# Patient Record
Sex: Female | Born: 1990 | Race: Black or African American | Hispanic: No | Marital: Single | Smoking: Never smoker
Health system: Southern US, Community
[De-identification: ages and names within clinical notes are randomized; demographics above are authoritative.]

## PROBLEM LIST (undated history)

## (undated) DIAGNOSIS — D649 Anemia, unspecified: Secondary | ICD-10-CM

## (undated) DIAGNOSIS — G4730 Sleep apnea, unspecified: Secondary | ICD-10-CM

## (undated) DIAGNOSIS — F32A Depression, unspecified: Secondary | ICD-10-CM

## (undated) DIAGNOSIS — F419 Anxiety disorder, unspecified: Secondary | ICD-10-CM

## (undated) DIAGNOSIS — T7840XA Allergy, unspecified, initial encounter: Secondary | ICD-10-CM

## (undated) DIAGNOSIS — I10 Essential (primary) hypertension: Secondary | ICD-10-CM

## (undated) HISTORY — PX: TOOTH EXTRACTION: SUR596

## (undated) HISTORY — DX: Depression, unspecified: F32.A

## (undated) HISTORY — DX: Sleep apnea, unspecified: G47.30

## (undated) HISTORY — DX: Essential (primary) hypertension: I10

## (undated) HISTORY — DX: Allergy, unspecified, initial encounter: T78.40XA

## (undated) HISTORY — DX: Anxiety disorder, unspecified: F41.9

## (undated) HISTORY — DX: Anemia, unspecified: D64.9

---
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
## 2013-12-06 ENCOUNTER — Emergency Department (INDEPENDENT_AMBULATORY_CARE_PROVIDER_SITE_OTHER)
Admission: EM | Admit: 2013-12-06 | Discharge: 2013-12-06 | Disposition: A | Discharge status: Home / Self Care | Payer: Medicaid Other | Source: Home / Self Care | Attending: Family Medicine | Admitting: Family Medicine

## 2013-12-06 ENCOUNTER — Encounter (HOSPITAL_COMMUNITY): Payer: Self-pay | Admitting: Emergency Medicine

## 2013-12-06 DIAGNOSIS — K589 Irritable bowel syndrome without diarrhea: Secondary | ICD-10-CM

## 2013-12-06 MED ORDER — ONDANSETRON HCL 4 MG PO TABS: 4.0000 mg | ORAL_TABLET | Freq: Four times a day (QID) | ORAL | Status: DC

## 2013-12-06 MED ORDER — RANITIDINE HCL 150 MG PO TABS: 150.0000 mg | ORAL_TABLET | Freq: Two times a day (BID) | ORAL | Status: DC

## 2013-12-06 MED ORDER — ONDANSETRON 4 MG PO TBDP
ORAL_TABLET | ORAL | Status: AC
  Filled 2013-12-06: qty 2

## 2013-12-06 MED ORDER — GI COCKTAIL ~~LOC~~
ORAL | Status: AC
  Filled 2013-12-06: qty 30

## 2013-12-06 MED ORDER — ONDANSETRON 4 MG PO TBDP
4.0000 mg | ORAL_TABLET | Freq: Once | ORAL | Status: AC
  Administered 2013-12-06: 4 mg via ORAL

## 2013-12-06 MED ORDER — GI COCKTAIL ~~LOC~~
30.0000 mL | Freq: Once | ORAL | Status: AC
  Administered 2013-12-06: 30 mL via ORAL

## 2013-12-06 NOTE — ED Provider Notes (Signed)
CSN: [id number]     Arrival date & time 12/06/13  1744 History   First MD Initiated Contact with Patient 12/06/13 1912     Chief Complaint  Patient presents with  . Cough   (Consider location/radiation/quality/duration/timing/severity/associated sxs/prior Treatment) Patient is a 23 y.o. female presenting with abdominal pain. The history is provided by the patient.  Abdominal Pain Pain location:  Epigastric Pain quality: burning and sharp   Pain radiates to:  Does not radiate Pain severity:  Mild Onset quality:  Gradual Duration:  2 days Progression:  Unchanged Chronicity:  New Context: eating   Context: not alcohol use, not awakening from sleep, not diet changes, not laxative use, not previous surgeries, not recent illness, not sick contacts and not suspicious food intake   Relieved by:  None tried Worsened by:  Nothing tried Ineffective treatments:  None tried Associated symptoms: diarrhea and nausea   Associated symptoms: no constipation, no dysuria, no fever, no melena, no vaginal bleeding, no vaginal discharge and no vomiting     History reviewed. No pertinent past medical history. History reviewed. No pertinent past surgical history. History reviewed. No pertinent family history. History  Substance Use Topics  . Smoking status: Never Smoker   . Smokeless tobacco: Not on file  . Alcohol Use: No   OB History   Grav Para Term Preterm Abortions TAB SAB Ect Mult Living                 Review of Systems  Constitutional: Negative.  Negative for fever.  HENT: Negative.   Gastrointestinal: Positive for nausea, abdominal pain and diarrhea. Negative for vomiting, constipation, blood in stool, melena and rectal pain.  Genitourinary: Negative for dysuria, vaginal bleeding and vaginal discharge.    Allergies  Shellfish allergy  Home Medications   Current Outpatient Rx  Name  Route  Sig  Dispense  Refill  . ondansetron (ZOFRAN) 4 MG tablet   Oral   Take 1 tablet (4 mg  total) by mouth every 6 (six) hours. Prn n/v   6 tablet   1   . ranitidine (ZANTAC) 150 MG tablet   Oral   Take 1 tablet (150 mg total) by mouth 2 (two) times daily.   60 tablet   0    BP 121/57  Pulse 98  Temp(Src) 98.6 F (37 C) (Oral)  Resp 16  SpO2 100% Physical Exam  Nursing note and vitals reviewed. Constitutional: She is oriented to person, place, and time. She appears well-developed and well-nourished.  HENT:  Mouth/Throat: Oropharynx is clear and moist.  Eyes: Conjunctivae are normal. Pupils are equal, round, and reactive to light.  Neck: Normal range of motion. Neck supple.  Pulmonary/Chest: Breath sounds normal.  Abdominal: Soft. Normal appearance and bowel sounds are normal. She exhibits no distension and no mass. There is no hepatosplenomegaly. There is tenderness in the epigastric area. There is no rebound and no guarding.  Lymphadenopathy:    She has no cervical adenopathy.  Neurological: She is alert and oriented to person, place, and time.  Skin: Skin is warm and dry.    ED Course  Procedures (including critical care time) Labs Review Labs Reviewed - No data to display Imaging Review No results found.  EKG Interpretation    Date/Time:    Ventricular Rate:    PR Interval:    QRS Duration:   QT Interval:    QTC Calculation:   R Axis:     Text Interpretation:  MDM      Linna Hoff, MD 12/06/13 [phone]

## 2013-12-06 NOTE — Discharge Instructions (Signed)
Clear liquid diet tonight as tolerated, advance on tues as improved, use medicine as needed, see your doctor if any problems.

## 2013-12-06 NOTE — ED Notes (Signed)
C/o cough , nausea since 1-3

## 2015-06-13 ENCOUNTER — Emergency Department (HOSPITAL_COMMUNITY)
Admission: EM | Admit: 2015-06-13 | Discharge: 2015-06-13 | Disposition: A | Discharge status: Home / Self Care | Payer: No Typology Code available for payment source | Attending: Emergency Medicine | Admitting: Emergency Medicine

## 2015-06-13 ENCOUNTER — Encounter (HOSPITAL_COMMUNITY): Payer: Self-pay | Admitting: Vascular Surgery

## 2015-06-13 DIAGNOSIS — Z79899 Other long term (current) drug therapy: Secondary | ICD-10-CM | POA: Insufficient documentation

## 2015-06-13 DIAGNOSIS — L0231 Cutaneous abscess of buttock: Secondary | ICD-10-CM | POA: Insufficient documentation

## 2015-06-13 MED ORDER — CEPHALEXIN 500 MG PO CAPS: 500.0000 mg | ORAL_CAPSULE | Freq: Four times a day (QID) | ORAL | Status: DC

## 2015-06-13 MED ORDER — CEPHALEXIN 250 MG PO CAPS
500.0000 mg | ORAL_CAPSULE | Freq: Once | ORAL | Status: AC
  Administered 2015-06-13: 500 mg via ORAL
  Filled 2015-06-13: qty 2

## 2015-06-13 NOTE — Discharge Instructions (Signed)
Abscess Take anabiotic says prescribed. Return for fever or no improvement in 48 hours. Return for recheck in 48 hours. Follow up with [place] [facility]. Apply warm compresses to the area several times a day. You could also soak in Epsom salt. An abscess (boil or furuncle) is an infected area on or under the skin. This area is filled with yellowish-white fluid (pus) and other material (debris). HOME CARE   Only take medicines as told by your doctor.  If you were given antibiotic medicine, take it as directed. Finish the medicine even if you start to feel better.  If gauze is used, follow your doctor's directions for changing the gauze.  To avoid spreading the infection:  Keep your abscess covered with a bandage.  Wash your hands well.  Do not share personal care items, towels, or whirlpools with others.  Avoid skin contact with others.  Keep your skin and clothes clean around the abscess.  Keep all doctor visits as told. GET HELP RIGHT AWAY IF:   You have more pain, puffiness (swelling), or redness in the wound site.  You have more fluid or blood coming from the wound site.  You have muscle aches, chills, or you feel sick.  You have a fever. MAKE SURE YOU:   Understand these instructions.  Will watch your condition.  Will get help right away if you are not doing well or get worse. Document Released: 05/06/2008 Document Revised: 05/19/2012 Document Reviewed: 01/31/2012 [facility] Patient Information 2015 [place], [place]. This information is not intended to replace advice given to you by your health care provider. Make sure you discuss any questions you have with your health care provider.

## 2015-06-13 NOTE — ED Notes (Signed)
Pt reports to the ED for eval of abscess to the right gluteal fold. Reports she noticed it on Satuday and has been using boil cream and warm epsom salt soaks however it continues to bother her. Increased swelling, erythema, pain, and warmth noted at site. It appears to have a pus collection below the surface of the skin. No drainage at this time. Pt A&Ox4, resp e/u, and skin warm and dry.

## 2015-06-13 NOTE — ED Provider Notes (Signed)
CSN: [id number]     Arrival date & time 06/13/15  2147 History   This chart was scribed for Catha GosselinHanna Patel-Mills, PA-C working with No att. providers found by Elveria Risingimelie Horne, ED Scribe. This patient was seen in room TR09C/TR09C and the patient's care was started at 10:25 PM.   Chief Complaint  Patient presents with  . Abscess   The history is provided by the patient. No language interpreter was used.   HPI Comments: Dana Burnett is a 24 y.o. female who presents to the Emergency Department complaining of a localized area of pain and swelling to right lower buttocks, which patient noticed 3-4 days ago. Patient uncertain of drainage from the site. Patient reports attempted treatment with warm soaks and topical cream, without relief. She states she has never had this before. Patient denies fever or chills.   History reviewed. No pertinent past medical history. History reviewed. No pertinent past surgical history. No family history on file. History  Substance Use Topics  . Smoking status: Never Smoker   . Smokeless tobacco: Not on file  . Alcohol Use: No   OB History    No data available     Review of Systems  Constitutional: Negative for fever.  Skin: Positive for color change.       Abscess    Allergies  Shellfish allergy  Home Medications   Prior to Admission medications   Medication Sig Start Date End Date Taking? Authorizing Provider  cephALEXin (KEFLEX) 500 MG capsule Take 1 capsule (500 mg total) by mouth 4 (four) times daily. 06/13/15   Ayden Apodaca Patel-Mills, PA-C  ondansetron (ZOFRAN) 4 MG tablet Take 1 tablet (4 mg total) by mouth every 6 (six) hours. Prn n/v 12/06/13   Linna HoffJames D Kindl, MD  ranitidine (ZANTAC) 150 MG tablet Take 1 tablet (150 mg total) by mouth 2 (two) times daily. 12/06/13   Linna HoffJames D Kindl, MD   Triage Vitals: BP 134/70 mmHg  Pulse 110  Temp(Src) 98.5 F (36.9 C) (Oral)  Resp 18  SpO2 100% Physical Exam  Constitutional: She is oriented to person, place, and  time. She appears well-developed and well-nourished. No distress.  HENT:  Head: Normocephalic and atraumatic.  Eyes: EOM are normal.  Neck: Neck supple. No tracheal deviation present.  Cardiovascular: Normal rate.   Pulmonary/Chest: Effort normal. No respiratory distress.  Musculoskeletal: Normal range of motion.  Neurological: She is alert and oriented to person, place, and time.  Skin: Skin is warm and dry.     Psychiatric: She has a normal mood and affect. Her behavior is normal.  Nursing note and vitals reviewed.   ED Course  Procedures (including critical care time)  COORDINATION OF CARE: 10:30 PM- Plans to assess with ultrasound. Discussed treatment plan with patient at bedside and patient agreed to plan.   Labs Review Labs Reviewed - No data to display  Imaging Review No results found.   EKG Interpretation None      MDM   Final diagnoses:  Abscess, gluteal, right   Vitals stable. Patient is afebrile. Abscess is not close to the anus or rectum. It is just above the crease of the right buttocks. Ultrasound does not show a pocket of fluid. I will put the patient on anabiotic's and discussed using warm compresses several times a day. She can also soak in Epsom salt. I discussed return precautions such as fever, no improvement within 48 hours. Patient verbally agrees with the plan. Medications  cephALEXin (KEFLEX) capsule 500  mg (500 mg Oral Given 06/13/15 2252)  I personally performed the services described in this documentation, which was scribed in my presence. The recorded information has been reviewed and is accurate.   Catha Gosselin, PA-C 06/15/15 1303  Eber Hong, MD 06/15/15 2350

## 2017-07-12 ENCOUNTER — Encounter (HOSPITAL_COMMUNITY): Payer: Self-pay | Admitting: *Deleted

## 2017-07-12 ENCOUNTER — Emergency Department (HOSPITAL_COMMUNITY): Payer: Self-pay

## 2017-07-12 ENCOUNTER — Inpatient Hospital Stay (HOSPITAL_COMMUNITY)
Admission: EM | Admit: 2017-07-12 | Discharge: 2017-07-15 | DRG: 418 | Disposition: A | Discharge status: Home / Self Care | Payer: Self-pay | Attending: Surgery | Admitting: Surgery

## 2017-07-12 DIAGNOSIS — R1010 Upper abdominal pain, unspecified: Secondary | ICD-10-CM

## 2017-07-12 DIAGNOSIS — K810 Acute cholecystitis: Secondary | ICD-10-CM

## 2017-07-12 DIAGNOSIS — Z6841 Body Mass Index (BMI) 40.0 and over, adult: Secondary | ICD-10-CM

## 2017-07-12 DIAGNOSIS — K8000 Calculus of gallbladder with acute cholecystitis without obstruction: Principal | ICD-10-CM | POA: Diagnosis present

## 2017-07-12 LAB — URINALYSIS, ROUTINE W REFLEX MICROSCOPIC
BILIRUBIN URINE: NEGATIVE
GLUCOSE, UA: NEGATIVE mg/dL
Hgb urine dipstick: NEGATIVE
Ketones, ur: NEGATIVE mg/dL
Leukocytes, UA: NEGATIVE
Nitrite: NEGATIVE
PH: 6 (ref 5.0–8.0)
Protein, ur: 100 mg/dL — AB
SPECIFIC GRAVITY, URINE: 1.026 (ref 1.005–1.030)

## 2017-07-12 LAB — CBC
HCT: 31.7 % — ABNORMAL LOW (ref 36.0–46.0)
Hemoglobin: 8.7 g/dL — ABNORMAL LOW (ref 12.0–15.0)
MCH: 15.6 pg — AB (ref 26.0–34.0)
MCHC: 27.4 g/dL — AB (ref 30.0–36.0)
MCV: 56.8 fL — AB (ref 78.0–100.0)
PLATELETS: 414 10*3/uL — AB (ref 150–400)
RBC: 5.58 MIL/uL — ABNORMAL HIGH (ref 3.87–5.11)
RDW: 20 % — AB (ref 11.5–15.5)
WBC: 11.1 10*3/uL — ABNORMAL HIGH (ref 4.0–10.5)

## 2017-07-12 LAB — COMPREHENSIVE METABOLIC PANEL
ALK PHOS: 62 U/L (ref 38–126)
ALT: 12 U/L — AB (ref 14–54)
AST: 20 U/L (ref 15–41)
Albumin: 3.4 g/dL — ABNORMAL LOW (ref 3.5–5.0)
Anion gap: 9 (ref 5–15)
BUN: 6 mg/dL (ref 6–20)
CALCIUM: 8.9 mg/dL (ref 8.9–10.3)
CHLORIDE: 97 mmol/L — AB (ref 101–111)
CO2: 26 mmol/L (ref 22–32)
CREATININE: 0.75 mg/dL (ref 0.44–1.00)
GFR calc Af Amer: >60 mL/min (ref >60)
GFR calc non Af Amer: >60 mL/min (ref >60)
Glucose, Bld: 114 mg/dL — ABNORMAL HIGH (ref 65–99)
Potassium: 3.6 mmol/L (ref 3.5–5.1)
SODIUM: 132 mmol/L — AB (ref 135–145)
Total Bilirubin: 0.7 mg/dL (ref 0.3–1.2)
Total Protein: 7.6 g/dL (ref 6.5–8.1)

## 2017-07-12 LAB — LIPASE, BLOOD: LIPASE: 25 U/L (ref 11–51)

## 2017-07-12 LAB — POC URINE PREG, ED: Preg Test, Ur: NEGATIVE

## 2017-07-12 LAB — I-STAT CG4 LACTIC ACID, ED: LACTIC ACID, VENOUS: 1.47 mmol/L (ref 0.5–1.9)

## 2017-07-12 MED ORDER — DEXTROSE 5 % IV SOLN
2.0000 g | Freq: Once | INTRAVENOUS | Status: AC
  Administered 2017-07-13: 2 g via INTRAVENOUS
  Filled 2017-07-12: qty 2

## 2017-07-12 MED ORDER — IOPAMIDOL (ISOVUE-300) INJECTION 61%
INTRAVENOUS | Status: AC
  Administered 2017-07-12: 100 mL
  Filled 2017-07-12: qty 100

## 2017-07-12 MED ORDER — FENTANYL CITRATE (PF) 100 MCG/2ML IJ SOLN
50.0000 ug | Freq: Once | INTRAMUSCULAR | Status: AC
  Administered 2017-07-12: 50 ug via INTRAVENOUS
  Filled 2017-07-12: qty 2

## 2017-07-12 MED ORDER — ONDANSETRON HCL 4 MG/2ML IJ SOLN
4.0000 mg | Freq: Once | INTRAMUSCULAR | Status: AC
  Administered 2017-07-12: 4 mg via INTRAVENOUS
  Filled 2017-07-12: qty 2

## 2017-07-12 MED ORDER — PANTOPRAZOLE SODIUM 40 MG IV SOLR
40.0000 mg | Freq: Once | INTRAVENOUS | Status: AC
  Administered 2017-07-12: 40 mg via INTRAVENOUS
  Filled 2017-07-12: qty 40

## 2017-07-12 NOTE — ED Triage Notes (Signed)
Pt c/o upper abd pain or epigastric pain  Since 2000 last pm.  Nausea vomiting    Last bm 2 days ago  lmp last month  No urinary symptoms

## 2017-07-12 NOTE — ED Provider Notes (Signed)
[facility]-EMERGENCY DEPT Provider Note   CSN: [id number] Arrival date & time: 07/12/17  2010     History   Chief Complaint Chief Complaint  Patient presents with  . Abdominal Pain    HPI Dana Burnett is a 26 y.o. female.  The history is provided by the patient. No language interpreter was used.  Abdominal Pain     Dana Burnett is a 26 y.o. female who presents to the Emergency Department complaining of abdominal pain.  She reports severe epigastric abdominal pain that started last night about 11:30. She has associated vomiting 6. No prior similar symptoms. She describes her pain as a cramping as well as scraping-type sensation. She does have some associated reflux symptoms. She denies any fevers, diarrhea, dysuria. No prior bowel surgeries no prior similar symptoms.  Symptoms are moderate to severe, constant, worsening. History reviewed. No pertinent past medical history.  There are no active problems to display for this patient.   History reviewed. No pertinent surgical history.  OB History    No data available       Home Medications    Prior to Admission medications   Medication Sig Start Date End Date Taking? Authorizing Provider  cephALEXin (KEFLEX) 500 MG capsule Take 1 capsule (500 mg total) by mouth 4 (four) times daily. Patient not taking: Reported on 07/12/2017 06/13/15   Patel-Mills, Lorelle Formosa, PA-C  ondansetron (ZOFRAN) 4 MG tablet Take 1 tablet (4 mg total) by mouth every 6 (six) hours. Prn n/v Patient not taking: Reported on 07/12/2017 12/06/13   Linna Hoff, MD  ranitidine (ZANTAC) 150 MG tablet Take 1 tablet (150 mg total) by mouth 2 (two) times daily. Patient not taking: Reported on 07/12/2017 12/06/13   Linna Hoff, MD    Family History No family history on file.  Social History Social History  Substance Use Topics  . Smoking status: Never Smoker  . Smokeless tobacco: Never Used  . Alcohol use No     Allergies   Shellfish allergy   Review of  Systems Review of Systems  Gastrointestinal: Positive for abdominal pain.  All other systems reviewed and are negative.    Physical Exam Updated Vital Signs BP 133/83   Pulse 82   Temp 100.1 F (37.8 C) (Oral)   Resp 18   Ht 5\' 1"  (1.549 m)   Wt (!) 145.7 kg (321 lb 3 oz)   LMP 06/11/2017   SpO2 100%   BMI 60.69 kg/m   Physical Exam  Constitutional: She is oriented to person, place, and time. She appears well-developed and well-nourished.  HENT:  Head: Normocephalic and atraumatic.  Cardiovascular: Regular rhythm.   No murmur heard. tachycardic  Pulmonary/Chest: Effort normal and breath sounds normal. No respiratory distress.  Abdominal: Soft. There is no rebound and no guarding.  Moderate epigastric, RUQ tenderness  Musculoskeletal: She exhibits no edema or tenderness.  Neurological: She is alert and oriented to person, place, and time.  Skin: Skin is warm and dry.  Psychiatric: She has a normal mood and affect. Her behavior is normal.  Nursing note and vitals reviewed.    ED Treatments / Results  Labs (all labs ordered are listed, but only abnormal results are displayed) Labs Reviewed  COMPREHENSIVE METABOLIC PANEL - Abnormal; Notable for the following:       Result Value   Sodium 132 (*)    Chloride 97 (*)    Glucose, Bld 114 (*)    Albumin 3.4 (*)  ALT 12 (*)    All other components within normal limits  CBC - Abnormal; Notable for the following:    WBC 11.1 (*)    RBC 5.58 (*)    Hemoglobin 8.7 (*)    HCT 31.7 (*)    MCV 56.8 (*)    MCH 15.6 (*)    MCHC 27.4 (*)    RDW 20.0 (*)    Platelets 414 (*)    All other components within normal limits  URINALYSIS, ROUTINE W REFLEX MICROSCOPIC - Abnormal; Notable for the following:    Color, Urine AMBER (*)    APPearance HAZY (*)    Protein, ur 100 (*)    Bacteria, UA RARE (*)    Squamous Epithelial / LPF 6-30 (*)    All other components within normal limits  LIPASE, BLOOD  POC URINE PREG, ED    I-STAT CG4 LACTIC ACID, ED  I-STAT CG4 LACTIC ACID, ED    EKG  EKG Interpretation None       Radiology Ct Abdomen Pelvis W Contrast  Result Date: 07/12/2017 CLINICAL DATA:  Acute onset of upper abdominal pain. Initial encounter. EXAM: CT ABDOMEN AND PELVIS WITH CONTRAST TECHNIQUE: Multidetector CT imaging of the abdomen and pelvis was performed using the standard protocol following bolus administration of intravenous contrast. CONTRAST:  ISOVUE-300 IOPAMIDOL (ISOVUE-300) INJECTION 61% COMPARISON:  None. FINDINGS: Lower chest: The visualized lung bases are grossly clear. The visualized portions of the mediastinum are unremarkable. Hepatobiliary: The liver is unremarkable in appearance. Gallbladder wall thickening is noted, with mild pericholecystic fluid and surrounding soft tissue inflammation, concerning for acute cholecystitis. The common bile duct remains normal in caliber. Pancreas: The pancreas is within normal limits. Spleen: The spleen is unremarkable in appearance. Adrenals/Urinary Tract: The adrenal glands are unremarkable in appearance. The kidneys are within normal limits. There is no evidence of hydronephrosis. No renal or ureteral stones are identified. No perinephric stranding is seen. Stomach/Bowel: The stomach is unremarkable in appearance. The small bowel is within normal limits. The appendix is normal in caliber, without evidence of appendicitis. The colon is unremarkable in appearance. Vascular/Lymphatic: The abdominal aorta is unremarkable in appearance. The inferior vena cava is grossly unremarkable. No retroperitoneal lymphadenopathy is seen. No pelvic sidewall lymphadenopathy is identified. Reproductive: The bladder is mildly distended and grossly unremarkable. The uterus is unremarkable in appearance. The ovaries are relatively symmetric. Trace free fluid within the pelvis may be physiologic in nature. Other: No additional soft tissue abnormalities are seen.  Musculoskeletal: No acute osseous abnormalities are identified. The visualized musculature is unremarkable in appearance. IMPRESSION: Gallbladder wall thickening, with mild pericholecystic fluid and surrounding soft tissue inflammation, concerning for acute cholecystitis. Electronically Signed   By: Roanna Raider M.D.   On: 07/12/2017 23:48   US Abdomen Limited Ruq  Result Date: 07/13/2017 CLINICAL DATA:  Acute onset of mid abdominal pain, nausea and vomiting. Initial encounter. EXAM: ULTRASOUND ABDOMEN LIMITED RIGHT UPPER QUADRANT COMPARISON:  CT of the abdomen and pelvis performed earlier today at 11:35 p.m. FINDINGS: Gallbladder: There is diffuse gallbladder wall thickening, with multiple stones measuring up to 1.4 cm in size. One of these is lodged at the neck of the gallbladder. No pericholecystic fluid is seen. No ultrasonographic Murphy's sign is elicited. Common bile duct: Diameter: 0.5 cm, within normal limits in caliber. Liver: No focal lesion identified. Within normal limits in parenchymal echogenicity. IMPRESSION: Diffuse gallbladder wall thickening, with multiple stones measuring up to 1.4 cm in size. One of these  is lodged at the neck of the gallbladder, and may be causing intermittent obstruction. No ultrasonographic Murphy's sign is elicited, though given gallbladder wall thickening, mild cholecystitis is a concern. Electronically Signed   By: Roanna RaiderJeffery  Chang M.D.   On: 07/13/2017 00:17    Procedures Procedures (including critical care time)  Medications Ordered in ED Medications  cefTRIAXone (ROCEPHIN) 2 g in dextrose 5 % 50 mL IVPB (not administered)  ondansetron (ZOFRAN) injection 4 mg (4 mg Intravenous Given 07/12/17 2135)  fentaNYL (SUBLIMAZE) injection 50 mcg (50 mcg Intravenous Given 07/12/17 2135)  pantoprazole (PROTONIX) injection 40 mg (40 mg Intravenous Given 07/12/17 2138)  iopamidol (ISOVUE-300) 61 % injection (100 mLs  Contrast Given 07/12/17 2326)     Initial Impression  / Assessment and Plan / ED Course  I have reviewed the triage vital signs and the nursing notes.  Pertinent labs & imaging results that were available during my care of the patient were reviewed by me and considered in my medical decision making (see chart for details).     Patient here for evaluation of epigastric and right upper quadrant pain. She has tenderness on examination across her epigastrium and in the right upper quadrant. Concern for biliary colic versus cholecystitis. Plan to obtain ultrasound to further image. If ultrasound is negative for acute process plan to obtain CT for further evaluation of her pain.    CT scan is consistent with acute cholecystitis. Gen. surgery consulted and patient updated findings.  Final Clinical Impressions(s) / ED Diagnoses   Final diagnoses:  Upper abdominal pain    New Prescriptions New Prescriptions   No medications on file     Tilden Fossaees, Phoenix Dresser, MD 07/13/17 0041

## 2017-07-13 ENCOUNTER — Encounter (HOSPITAL_COMMUNITY): Payer: Self-pay | Admitting: *Deleted

## 2017-07-13 DIAGNOSIS — K8000 Calculus of gallbladder with acute cholecystitis without obstruction: Secondary | ICD-10-CM | POA: Diagnosis present

## 2017-07-13 LAB — HIV ANTIBODY (ROUTINE TESTING W REFLEX): HIV SCREEN 4TH GENERATION: NONREACTIVE

## 2017-07-13 MED ORDER — ONDANSETRON HCL 4 MG/2ML IJ SOLN: 4.0000 mg | Freq: Four times a day (QID) | INTRAMUSCULAR | Status: DC | PRN

## 2017-07-13 MED ORDER — ENOXAPARIN SODIUM 40 MG/0.4ML ~~LOC~~ SOLN: 40.0000 mg | SUBCUTANEOUS | Status: DC

## 2017-07-13 MED ORDER — ONDANSETRON 4 MG PO TBDP
4.0000 mg | ORAL_TABLET | Freq: Four times a day (QID) | ORAL | Status: DC | PRN
  Filled 2017-07-13: qty 1

## 2017-07-13 MED ORDER — HYDROMORPHONE HCL 1 MG/ML IJ SOLN
1.0000 mg | INTRAMUSCULAR | Status: DC | PRN
  Administered 2017-07-13 (×3): 1 mg via INTRAVENOUS
  Filled 2017-07-13 (×3): qty 1

## 2017-07-13 MED ORDER — POTASSIUM CHLORIDE IN NACL 20-0.9 MEQ/L-% IV SOLN
INTRAVENOUS | Status: DC
  Administered 2017-07-13: 02:00:00 via INTRAVENOUS
  Filled 2017-07-13 (×2): qty 1000

## 2017-07-13 MED ORDER — ENOXAPARIN SODIUM 40 MG/0.4ML ~~LOC~~ SOLN
40.0000 mg | SUBCUTANEOUS | Status: DC
  Administered 2017-07-15: 40 mg via SUBCUTANEOUS
  Filled 2017-07-13: qty 0.4

## 2017-07-13 NOTE — H&P (Signed)
Dana Burnett is an 26 y.o. female.   Chief Complaint: abdominal pain HPI: this is a 26 year old female who presented with epigastric abdominal pain, nausea, and vomiting. This started yesterday.She has had no previous similar attacks. She described the pain as sharp and burning. She denied fevers or chills.  He had an ultrasound showing diffuse gallbladder wall thickening with multiple gallstones and one gallstone lodged in the neck of the gallbladder.  She had an elevated white blood count.  Her liver function tests are normal.  She is otherwise without complaints  History reviewed. No pertinent past medical history.  History reviewed. No pertinent surgical history.  No family history on file. Social History:  reports that she has never smoked. She has never used smokeless tobacco. She reports that she does not drink alcohol or use drugs.  Allergies:  Allergies  Allergen Reactions  . Shellfish Allergy Anaphylaxis    Medications Prior to Admission  Medication Sig Dispense Refill  . cephALEXin (KEFLEX) 500 MG capsule Take 1 capsule (500 mg total) by mouth 4 (four) times daily. (Patient not taking: Reported on 07/12/2017) 20 capsule 0  . ondansetron (ZOFRAN) 4 MG tablet Take 1 tablet (4 mg total) by mouth every 6 (six) hours. Prn n/v (Patient not taking: Reported on 07/12/2017) 6 tablet 1  . ranitidine (ZANTAC) 150 MG tablet Take 1 tablet (150 mg total) by mouth 2 (two) times daily. (Patient not taking: Reported on 07/12/2017) 60 tablet 0    Results for orders placed or performed during the hospital encounter of 07/12/17 (from the past 48 hour(s))  Lipase, blood     Status: None   Collection Time: 07/12/17  8:20 PM  Result Value Ref Range   Lipase 25 11 - 51 U/L  Comprehensive metabolic panel     Status: Abnormal   Collection Time: 07/12/17  8:20 PM  Result Value Ref Range   Sodium 132 (L) 135 - 145 mmol/L   Potassium 3.6 3.5 - 5.1 mmol/L   Chloride 97 (L) 101 - 111 mmol/L   CO2 26  22 - 32 mmol/L   Glucose, Bld 114 (H) 65 - 99 mg/dL   BUN 6 6 - 20 mg/dL   Creatinine, Ser 0.75 0.44 - 1.00 mg/dL   Calcium 8.9 8.9 - 10.3 mg/dL   Total Protein 7.6 6.5 - 8.1 g/dL   Albumin 3.4 (L) 3.5 - 5.0 g/dL   AST 20 15 - 41 U/L   ALT 12 (L) 14 - 54 U/L   Alkaline Phosphatase 62 38 - 126 U/L   Total Bilirubin 0.7 0.3 - 1.2 mg/dL   GFR calc non Af Amer >60 >60 mL/min   GFR calc Af Amer >60 >60 mL/min    Comment: (NOTE) The eGFR has been calculated using the CKD EPI equation. This calculation has not been validated in all clinical situations. eGFR's persistently <60 mL/min signify possible Chronic Kidney Disease.    Anion gap 9 5 - 15  CBC     Status: Abnormal   Collection Time: 07/12/17  8:20 PM  Result Value Ref Range   WBC 11.1 (H) 4.0 - 10.5 K/uL   RBC 5.58 (H) 3.87 - 5.11 MIL/uL   Hemoglobin 8.7 (L) 12.0 - 15.0 g/dL   HCT 31.7 (L) 36.0 - 46.0 %   MCV 56.8 (L) 78.0 - 100.0 fL   MCH 15.6 (L) 26.0 - 34.0 pg   MCHC 27.4 (L) 30.0 - 36.0 g/dL   RDW 20.0 (H) 11.5 -  15.5 %   Platelets 414 (H) 150 - 400 K/uL  Urinalysis, Routine w reflex microscopic     Status: Abnormal   Collection Time: 07/12/17  8:23 PM  Result Value Ref Range   Color, Urine AMBER (A) YELLOW    Comment: BIOCHEMICALS MAY BE AFFECTED BY COLOR   APPearance HAZY (A) CLEAR   Specific Gravity, Urine 1.026 1.005 - 1.030   pH 6.0 5.0 - 8.0   Glucose, UA NEGATIVE NEGATIVE mg/dL   Hgb urine dipstick NEGATIVE NEGATIVE   Bilirubin Urine NEGATIVE NEGATIVE   Ketones, ur NEGATIVE NEGATIVE mg/dL   Protein, ur 100 (A) NEGATIVE mg/dL   Nitrite NEGATIVE NEGATIVE   Leukocytes, UA NEGATIVE NEGATIVE   RBC / HPF 0-5 0 - 5 RBC/hpf   WBC, UA 6-30 0 - 5 WBC/hpf   Bacteria, UA RARE (A) NONE SEEN   Squamous Epithelial / LPF 6-30 (A) NONE SEEN   Mucous PRESENT    Hyaline Casts, UA PRESENT   I-Stat CG4 Lactic Acid, ED     Status: None   Collection Time: 07/12/17  8:43 PM  Result Value Ref Range   Lactic Acid, Venous 1.47  0.5 - 1.9 mmol/L  POC urine preg, ED     Status: None   Collection Time: 07/12/17  9:52 PM  Result Value Ref Range   Preg Test, Ur NEGATIVE NEGATIVE    Comment:        THE SENSITIVITY OF THIS METHODOLOGY IS >24 mIU/mL    Ct Abdomen Pelvis W Contrast  Result Date: 07/12/2017 CLINICAL DATA:  Acute onset of upper abdominal pain. Initial encounter. EXAM: CT ABDOMEN AND PELVIS WITH CONTRAST TECHNIQUE: Multidetector CT imaging of the abdomen and pelvis was performed using the standard protocol following bolus administration of intravenous contrast. CONTRAST:  132m ISOVUE-300 IOPAMIDOL (ISOVUE-300) INJECTION 61% COMPARISON:  None. FINDINGS: Lower chest: The visualized lung bases are grossly clear. The visualized portions of the mediastinum are unremarkable. Hepatobiliary: The liver is unremarkable in appearance. Gallbladder wall thickening is noted, with mild pericholecystic fluid and surrounding soft tissue inflammation, concerning for acute cholecystitis. The common bile duct remains normal in caliber. Pancreas: The pancreas is within normal limits. Spleen: The spleen is unremarkable in appearance. Adrenals/Urinary Tract: The adrenal glands are unremarkable in appearance. The kidneys are within normal limits. There is no evidence of hydronephrosis. No renal or ureteral stones are identified. No perinephric stranding is seen. Stomach/Bowel: The stomach is unremarkable in appearance. The small bowel is within normal limits. The appendix is normal in caliber, without evidence of appendicitis. The colon is unremarkable in appearance. Vascular/Lymphatic: The abdominal aorta is unremarkable in appearance. The inferior vena cava is grossly unremarkable. No retroperitoneal lymphadenopathy is seen. No pelvic sidewall lymphadenopathy is identified. Reproductive: The bladder is mildly distended and grossly unremarkable. The uterus is unremarkable in appearance. The ovaries are relatively symmetric. Trace free fluid  within the pelvis may be physiologic in nature. Other: No additional soft tissue abnormalities are seen. Musculoskeletal: No acute osseous abnormalities are identified. The visualized musculature is unremarkable in appearance. IMPRESSION: Gallbladder wall thickening, with mild pericholecystic fluid and surrounding soft tissue inflammation, concerning for acute cholecystitis. Electronically Signed   By: JGarald BaldingM.D.   On: 07/12/2017 23:48   UKoreaAbdomen Limited Ruq  Result Date: 07/13/2017 CLINICAL DATA:  Acute onset of mid abdominal pain, nausea and vomiting. Initial encounter. EXAM: ULTRASOUND ABDOMEN LIMITED RIGHT UPPER QUADRANT COMPARISON:  CT of the abdomen and pelvis performed earlier today at  11:35 p.m. FINDINGS: Gallbladder: There is diffuse gallbladder wall thickening, with multiple stones measuring up to 1.4 cm in size. One of these is lodged at the neck of the gallbladder. No pericholecystic fluid is seen. No ultrasonographic Murphy's sign is elicited. Common bile duct: Diameter: 0.5 cm, within normal limits in caliber. Liver: No focal lesion identified. Within normal limits in parenchymal echogenicity. IMPRESSION: Diffuse gallbladder wall thickening, with multiple stones measuring up to 1.4 cm in size. One of these is lodged at the neck of the gallbladder, and may be causing intermittent obstruction. No ultrasonographic Murphy's sign is elicited, though given gallbladder wall thickening, mild cholecystitis is a concern. Electronically Signed   By: Garald Balding M.D.   On: 07/13/2017 00:17    Review of Systems  Constitutional: Negative for chills and fever.  HENT: Negative for sore throat.   Respiratory: Negative for cough and shortness of breath.   Cardiovascular: Negative for chest pain.  Gastrointestinal: Positive for abdominal pain, nausea and vomiting.  Genitourinary: Negative for dysuria.    Blood pressure (!) 127/97, pulse 96, temperature 99.5 F (37.5 C), temperature source  Oral, resp. rate 18, height '5\' 1"'$  (1.549 m), weight (!) 145.7 kg (321 lb 3 oz), last menstrual period 06/11/2017, SpO2 100 %. Physical Exam  Constitutional: She is oriented to person, place, and time. She appears well-developed and well-nourished. No distress.  Morbidly obese  HENT:  Head: Normocephalic and atraumatic.  Right Ear: External ear normal.  Left Ear: External ear normal.  Nose: Nose normal.  Mouth/Throat: Oropharynx is clear and moist. No oropharyngeal exudate.  Eyes: Pupils are equal, round, and reactive to light. Right eye exhibits no discharge. Left eye exhibits no discharge. No scleral icterus.  Neck: Normal range of motion. No tracheal deviation present.  Cardiovascular: Normal rate, regular rhythm, normal heart sounds and intact distal pulses.   Respiratory: Effort normal and breath sounds normal. No respiratory distress. She has no wheezes.  GI: Soft. There is tenderness.  Her abdomen is morbidly obese. There is some tenderness with guarding in the right upper quadrant  Musculoskeletal: Normal range of motion. She exhibits no edema.  Lymphadenopathy:    She has no cervical adenopathy.  Neurological: She is alert and oriented to person, place, and time.  Skin: Skin is warm and dry. No erythema.  Psychiatric: Her behavior is normal. Judgment normal.     Assessment/Plan Acute cholecystitis with cholelithiasis  I discussed the diagnosis with the patient. Admission for IV antibiotics and eventual laparoscopic cholecystectomy is recommended. I discussed the reasons for this with her. I briefly discussed the surgical procedure. She understands and wishes to proceed with these current plans.  Harl Bowie, MD 07/13/2017, 6:31 AM

## 2017-07-14 ENCOUNTER — Inpatient Hospital Stay (HOSPITAL_COMMUNITY): Payer: Self-pay | Admitting: Certified Registered Nurse Anesthetist

## 2017-07-14 ENCOUNTER — Encounter (HOSPITAL_COMMUNITY): Admission: EM | Disposition: A | Discharge status: Home / Self Care | Payer: Self-pay | Source: Home / Self Care

## 2017-07-14 HISTORY — PX: CHOLECYSTECTOMY: SHX55

## 2017-07-14 LAB — HEPATIC FUNCTION PANEL
ALT: 11 U/L — ABNORMAL LOW (ref 14–54)
AST: 15 U/L (ref 15–41)
Albumin: 3 g/dL — ABNORMAL LOW (ref 3.5–5.0)
Alkaline Phosphatase: 51 U/L (ref 38–126)
Bilirubin, Direct: 0.1 mg/dL (ref 0.1–0.5)
Indirect Bilirubin: 0.6 mg/dL (ref 0.3–0.9)
Total Bilirubin: 0.7 mg/dL (ref 0.3–1.2)
Total Protein: 6.9 g/dL (ref 6.5–8.1)

## 2017-07-14 LAB — CBC
HCT: 28.6 % — ABNORMAL LOW (ref 36.0–46.0)
Hemoglobin: 7.6 g/dL — ABNORMAL LOW (ref 12.0–15.0)
MCH: 15.4 pg — ABNORMAL LOW (ref 26.0–34.0)
MCHC: 26.6 g/dL — ABNORMAL LOW (ref 30.0–36.0)
MCV: 57.9 fL — ABNORMAL LOW (ref 78.0–100.0)
Platelets: 357 10*3/uL (ref 150–400)
RBC: 4.94 MIL/uL (ref 3.87–5.11)
RDW: 20.1 % — ABNORMAL HIGH (ref 11.5–15.5)
WBC: 8.2 10*3/uL (ref 4.0–10.5)

## 2017-07-14 LAB — SURGICAL PCR SCREEN
MRSA, PCR: NEGATIVE
STAPHYLOCOCCUS AUREUS: NEGATIVE

## 2017-07-14 SURGERY — LAPAROSCOPIC CHOLECYSTECTOMY WITH INTRAOPERATIVE CHOLANGIOGRAM
Anesthesia: General | Site: Abdomen

## 2017-07-14 MED ORDER — ROCURONIUM BROMIDE 10 MG/ML (PF) SYRINGE
PREFILLED_SYRINGE | INTRAVENOUS | Status: AC
  Filled 2017-07-14: qty 5

## 2017-07-14 MED ORDER — HYDROMORPHONE HCL 1 MG/ML IJ SOLN: 1.0000 mg | INTRAMUSCULAR | Status: DC | PRN

## 2017-07-14 MED ORDER — PHENYLEPHRINE HCL 10 MG/ML IJ SOLN
INTRAMUSCULAR | Status: DC | PRN
  Administered 2017-07-14: 40 ug via INTRAVENOUS
  Administered 2017-07-14 (×2): 80 ug via INTRAVENOUS
  Administered 2017-07-14: 120 ug via INTRAVENOUS
  Administered 2017-07-14: 80 ug via INTRAVENOUS

## 2017-07-14 MED ORDER — HYDROMORPHONE HCL 1 MG/ML IJ SOLN
INTRAMUSCULAR | Status: AC
  Filled 2017-07-14: qty 1

## 2017-07-14 MED ORDER — SUCCINYLCHOLINE CHLORIDE 200 MG/10ML IV SOSY
PREFILLED_SYRINGE | INTRAVENOUS | Status: AC
  Filled 2017-07-14: qty 10

## 2017-07-14 MED ORDER — ONDANSETRON HCL 4 MG/2ML IJ SOLN
INTRAMUSCULAR | Status: AC
  Filled 2017-07-14: qty 2

## 2017-07-14 MED ORDER — OXYCODONE HCL 5 MG PO TABS
5.0000 mg | ORAL_TABLET | ORAL | Status: DC | PRN
  Administered 2017-07-14 – 2017-07-15 (×2): 5 mg via ORAL
  Filled 2017-07-14 (×3): qty 1

## 2017-07-14 MED ORDER — BUPIVACAINE-EPINEPHRINE (PF) 0.25% -1:200000 IJ SOLN
INTRAMUSCULAR | Status: AC
  Filled 2017-07-14: qty 30

## 2017-07-14 MED ORDER — FENTANYL CITRATE (PF) 100 MCG/2ML IJ SOLN
INTRAMUSCULAR | Status: DC | PRN
  Administered 2017-07-14: 100 ug via INTRAVENOUS

## 2017-07-14 MED ORDER — IBUPROFEN 200 MG PO TABS: 400.0000 mg | ORAL_TABLET | Freq: Four times a day (QID) | ORAL | Status: DC | PRN

## 2017-07-14 MED ORDER — LIDOCAINE 2% (20 MG/ML) 5 ML SYRINGE
INTRAMUSCULAR | Status: AC
  Filled 2017-07-14: qty 5

## 2017-07-14 MED ORDER — PROMETHAZINE HCL 25 MG/ML IJ SOLN: 6.2500 mg | INTRAMUSCULAR | Status: DC | PRN

## 2017-07-14 MED ORDER — OXYCODONE HCL 5 MG PO TABS
ORAL_TABLET | ORAL | Status: AC
  Filled 2017-07-14: qty 1

## 2017-07-14 MED ORDER — SUCCINYLCHOLINE CHLORIDE 20 MG/ML IJ SOLN
INTRAMUSCULAR | Status: DC | PRN
Start: 2017-07-14 — End: 2017-07-14
  Administered 2017-07-14: 140 mg via INTRAVENOUS

## 2017-07-14 MED ORDER — DEXAMETHASONE SODIUM PHOSPHATE 10 MG/ML IJ SOLN
INTRAMUSCULAR | Status: DC | PRN
  Administered 2017-07-14: 10 mg via INTRAVENOUS

## 2017-07-14 MED ORDER — OXYCODONE HCL 5 MG/5ML PO SOLN: 5.0000 mg | Freq: Once | ORAL | Status: AC | PRN

## 2017-07-14 MED ORDER — PROPOFOL 10 MG/ML IV BOLUS
INTRAVENOUS | Status: AC
  Filled 2017-07-14: qty 20

## 2017-07-14 MED ORDER — DEXTROSE 5 % IV SOLN
3.0000 g | INTRAVENOUS | Status: AC
  Administered 2017-07-14: 3 g via INTRAVENOUS
  Filled 2017-07-14: qty 3000

## 2017-07-14 MED ORDER — MIDAZOLAM HCL 5 MG/5ML IJ SOLN
INTRAMUSCULAR | Status: DC | PRN
  Administered 2017-07-14: 2 mg via INTRAVENOUS

## 2017-07-14 MED ORDER — EPHEDRINE 5 MG/ML INJ
INTRAVENOUS | Status: AC
  Filled 2017-07-14: qty 10

## 2017-07-14 MED ORDER — ACETAMINOPHEN 500 MG PO TABS
1000.0000 mg | ORAL_TABLET | Freq: Three times a day (TID) | ORAL | Status: DC
  Administered 2017-07-14 – 2017-07-15 (×4): 1000 mg via ORAL
  Filled 2017-07-14 (×4): qty 2

## 2017-07-14 MED ORDER — SUGAMMADEX SODIUM 500 MG/5ML IV SOLN
INTRAVENOUS | Status: DC | PRN
  Administered 2017-07-14: 291.4 mg via INTRAVENOUS

## 2017-07-14 MED ORDER — FAMOTIDINE IN NACL 20-0.9 MG/50ML-% IV SOLN
20.0000 mg | Freq: Once | INTRAVENOUS | Status: AC
  Administered 2017-07-14: 20 mg via INTRAVENOUS

## 2017-07-14 MED ORDER — MIDAZOLAM HCL 2 MG/2ML IJ SOLN
INTRAMUSCULAR | Status: AC
  Filled 2017-07-14: qty 2

## 2017-07-14 MED ORDER — HYDROMORPHONE HCL 1 MG/ML IJ SOLN
0.2500 mg | INTRAMUSCULAR | Status: DC | PRN
  Administered 2017-07-14 (×2): 0.5 mg via INTRAVENOUS

## 2017-07-14 MED ORDER — IOPAMIDOL (ISOVUE-300) INJECTION 61%
INTRAVENOUS | Status: AC
  Filled 2017-07-14: qty 50

## 2017-07-14 MED ORDER — FAMOTIDINE IN NACL 20-0.9 MG/50ML-% IV SOLN
INTRAVENOUS | Status: AC
  Administered 2017-07-14: 20 mg via INTRAVENOUS
  Filled 2017-07-14: qty 50

## 2017-07-14 MED ORDER — ESMOLOL HCL 100 MG/10ML IV SOLN
INTRAVENOUS | Status: AC
  Filled 2017-07-14: qty 10

## 2017-07-14 MED ORDER — ONDANSETRON HCL 4 MG/2ML IJ SOLN
INTRAMUSCULAR | Status: DC | PRN
  Administered 2017-07-14: 4 mg via INTRAVENOUS

## 2017-07-14 MED ORDER — 0.9 % SODIUM CHLORIDE (POUR BTL) OPTIME
TOPICAL | Status: DC | PRN
  Administered 2017-07-14: 1000 mL

## 2017-07-14 MED ORDER — ALBUTEROL SULFATE HFA 108 (90 BASE) MCG/ACT IN AERS
INHALATION_SPRAY | RESPIRATORY_TRACT | Status: DC | PRN
  Administered 2017-07-14: 6 via RESPIRATORY_TRACT

## 2017-07-14 MED ORDER — LACTATED RINGERS IV SOLN
INTRAVENOUS | Status: DC | PRN
  Administered 2017-07-14: 11:00:00 via INTRAVENOUS

## 2017-07-14 MED ORDER — ROCURONIUM BROMIDE 100 MG/10ML IV SOLN
INTRAVENOUS | Status: DC | PRN
  Administered 2017-07-14: 10 mg via INTRAVENOUS
  Administered 2017-07-14: 50 mg via INTRAVENOUS

## 2017-07-14 MED ORDER — LIDOCAINE HCL (CARDIAC) 20 MG/ML IV SOLN
INTRAVENOUS | Status: DC | PRN
  Administered 2017-07-14: 60 mg via INTRAVENOUS

## 2017-07-14 MED ORDER — OXYCODONE HCL 5 MG PO TABS
5.0000 mg | ORAL_TABLET | Freq: Once | ORAL | Status: AC | PRN
  Administered 2017-07-14: 5 mg via ORAL

## 2017-07-14 MED ORDER — FENTANYL CITRATE (PF) 250 MCG/5ML IJ SOLN
INTRAMUSCULAR | Status: AC
  Filled 2017-07-14: qty 5

## 2017-07-14 MED ORDER — BUPIVACAINE-EPINEPHRINE 0.25% -1:200000 IJ SOLN
INTRAMUSCULAR | Status: DC | PRN
  Administered 2017-07-14: 17 mL

## 2017-07-14 MED ORDER — PHENYLEPHRINE 40 MCG/ML (10ML) SYRINGE FOR IV PUSH (FOR BLOOD PRESSURE SUPPORT)
PREFILLED_SYRINGE | INTRAVENOUS | Status: AC
  Filled 2017-07-14: qty 20

## 2017-07-14 MED ORDER — SUGAMMADEX SODIUM 500 MG/5ML IV SOLN
INTRAVENOUS | Status: AC
  Filled 2017-07-14: qty 5

## 2017-07-14 MED ORDER — CEFAZOLIN SODIUM 1 G IJ SOLR
INTRAMUSCULAR | Status: AC
  Filled 2017-07-14: qty 20

## 2017-07-14 MED ORDER — LACTATED RINGERS IV SOLN
INTRAVENOUS | Status: DC
  Administered 2017-07-14: 10:00:00 via INTRAVENOUS

## 2017-07-14 MED ORDER — PROPOFOL 10 MG/ML IV BOLUS
INTRAVENOUS | Status: DC | PRN
  Administered 2017-07-14: 200 mg via INTRAVENOUS

## 2017-07-14 MED ORDER — POTASSIUM CHLORIDE IN NACL 20-0.9 MEQ/L-% IV SOLN
INTRAVENOUS | Status: DC
  Administered 2017-07-14: 15:00:00 via INTRAVENOUS

## 2017-07-14 MED ORDER — SODIUM CHLORIDE 0.9 % IR SOLN
Status: DC | PRN
  Administered 2017-07-14: 1000 mL

## 2017-07-14 SURGICAL SUPPLY — 50 items
APL SKNCLS STERI-STRIP NONHPOA (GAUZE/BANDAGES/DRESSINGS) ×1
APPLIER CLIP ROT 10 11.4 M/L (STAPLE) ×3
APR CLP MED LRG 11.4X10 (STAPLE) ×1
BAG SPEC RTRVL 10 TROC 200 (ENDOMECHANICALS) ×1
BAG SPEC RTRVL LRG 6X4 10 (ENDOMECHANICALS) ×1
BENZOIN TINCTURE PRP APPL 2/3 (GAUZE/BANDAGES/DRESSINGS) ×3 IMPLANT
BLADE CLIPPER SURG (BLADE) IMPLANT
CANISTER SUCT 3000ML PPV (MISCELLANEOUS) ×3 IMPLANT
CHLORAPREP W/TINT 26ML (MISCELLANEOUS) ×3 IMPLANT
CLIP APPLIE ROT 10 11.4 M/L (STAPLE) ×1 IMPLANT
CLOSURE STERI-STRIP 1/2X4 (GAUZE/BANDAGES/DRESSINGS) ×1
CLOSURE WOUND 1/2 X4 (GAUZE/BANDAGES/DRESSINGS) ×1
CLSR STERI-STRIP ANTIMIC 1/2X4 (GAUZE/BANDAGES/DRESSINGS) ×1 IMPLANT
COVER MAYO STAND STRL (DRAPES) ×3 IMPLANT
COVER SURGICAL LIGHT HANDLE (MISCELLANEOUS) ×3 IMPLANT
DRAPE C-ARM 42X72 X-RAY (DRAPES) ×3 IMPLANT
DRSG TEGADERM 2-3/8X2-3/4 SM (GAUZE/BANDAGES/DRESSINGS) ×9 IMPLANT
DRSG TEGADERM 4X4.75 (GAUZE/BANDAGES/DRESSINGS) ×3 IMPLANT
ELECT REM PT RETURN 9FT ADLT (ELECTROSURGICAL) ×3
ELECTRODE REM PT RTRN 9FT ADLT (ELECTROSURGICAL) ×1 IMPLANT
FILTER SMOKE EVAC LAPAROSHD (FILTER) ×3 IMPLANT
GAUZE SPONGE 2X2 8PLY NS (GAUZE/BANDAGES/DRESSINGS) ×2 IMPLANT
GAUZE SPONGE 2X2 8PLY STRL LF (GAUZE/BANDAGES/DRESSINGS) ×1 IMPLANT
GLOVE BIO SURGEON STRL SZ7 (GLOVE) ×3 IMPLANT
GLOVE BIOGEL PI IND STRL 7.5 (GLOVE) ×1 IMPLANT
GLOVE BIOGEL PI INDICATOR 7.5 (GLOVE) ×2
GOWN STRL REUS W/ TWL LRG LVL3 (GOWN DISPOSABLE) ×3 IMPLANT
GOWN STRL REUS W/TWL LRG LVL3 (GOWN DISPOSABLE) ×9
KIT BASIN OR (CUSTOM PROCEDURE TRAY) ×3 IMPLANT
KIT ROOM TURNOVER OR (KITS) ×3 IMPLANT
NS IRRIG 1000ML POUR BTL (IV SOLUTION) ×3 IMPLANT
PAD ARMBOARD 7.5X6 YLW CONV (MISCELLANEOUS) ×3 IMPLANT
POUCH RETRIEVAL ECOSAC 10 (ENDOMECHANICALS) IMPLANT
POUCH RETRIEVAL ECOSAC 10MM (ENDOMECHANICALS) ×2
POUCH SPECIMEN RETRIEVAL 10MM (ENDOMECHANICALS) ×3 IMPLANT
SCISSORS LAP 5X35 DISP (ENDOMECHANICALS) ×3 IMPLANT
SET CHOLANGIOGRAPH 5 50 .035 (SET/KITS/TRAYS/PACK) ×3 IMPLANT
SET IRRIG TUBING LAPAROSCOPIC (IRRIGATION / IRRIGATOR) ×3 IMPLANT
SLEEVE ENDOPATH XCEL 5M (ENDOMECHANICALS) ×3 IMPLANT
SPECIMEN JAR SMALL (MISCELLANEOUS) ×3 IMPLANT
SPONGE GAUZE 2X2 STER 10/PKG (GAUZE/BANDAGES/DRESSINGS) ×4
STRIP CLOSURE SKIN 1/2X4 (GAUZE/BANDAGES/DRESSINGS) ×2 IMPLANT
SUT MNCRL AB 4-0 PS2 18 (SUTURE) ×3 IMPLANT
TOWEL OR 17X24 6PK STRL BLUE (TOWEL DISPOSABLE) ×3 IMPLANT
TOWEL OR 17X26 10 PK STRL BLUE (TOWEL DISPOSABLE) ×3 IMPLANT
TRAY LAPAROSCOPIC MC (CUSTOM PROCEDURE TRAY) ×3 IMPLANT
TROCAR XCEL BLUNT TIP 100MML (ENDOMECHANICALS) ×3 IMPLANT
TROCAR XCEL NON-BLD 11X100MML (ENDOMECHANICALS) ×3 IMPLANT
TROCAR XCEL NON-BLD 5MMX100MML (ENDOMECHANICALS) ×3 IMPLANT
TUBING INSUFFLATION (TUBING) ×3 IMPLANT

## 2017-07-14 NOTE — Op Note (Signed)
Laparoscopic Cholecystectomy Procedure Note  Indications: This patient presents with symptomatic gallbladder disease and will undergo laparoscopic cholecystectomy.  Pre-operative Diagnosis: Calculus of gallbladder with acute cholecystitis, without mention of obstruction  Post-operative Diagnosis: Same  Surgeon: Mabel Roll K.   Assistants: none  Anesthesia: General endotracheal anesthesia  ASA Class: 2  Procedure Details  The patient was seen again in the Holding Room. The risks, benefits, complications, treatment options, and expected outcomes were discussed with the patient. The possibilities of reaction to medication, pulmonary aspiration, perforation of viscus, bleeding, recurrent infection, finding a normal gallbladder, the need for additional procedures, failure to diagnose a condition, the possible need to convert to an open procedure, and creating a complication requiring transfusion or operation were discussed with the patient. The likelihood of improving the patient's symptoms with return to their baseline status is good.  The patient and/or family concurred with the proposed plan, giving informed consent. The site of surgery properly noted. The patient was taken to Operating Room, identified as Dana Burnett and the procedure verified as Laparoscopic Cholecystectomy with Intraoperative Cholangiogram. A Time Out was held and the above information confirmed.  Prior to the induction of general anesthesia, antibiotic prophylaxis was administered. General endotracheal anesthesia was then administered and tolerated well. After the induction, the abdomen was prepped with Chloraprep and draped in sterile fashion. The patient was positioned in the supine position.  Local anesthetic agent was injected into the skin near the umbilicus and an incision made. We dissected down to the abdominal fascia with blunt dissection.  The fascia was incised vertically and we entered the peritoneal cavity  bluntly.  A pursestring suture of 0-Vicryl was placed around the fascial opening.  The Hasson cannula was inserted and secured with the stay suture.  Pneumoperitoneum was then created with CO2 and tolerated well without any adverse changes in the patient's vital signs. An 11-mm port was placed in the subxiphoid position.  Two 5-mm ports were placed in the right upper quadrant. All skin incisions were infiltrated with a local anesthetic agent before making the incision and placing the trocars.   We positioned the patient in reverse Trendelenburg, tilted slightly to the patient's left.  The gallbladder was identified and was very distended, edematous, and erythematous.  We decompressed the gallbladder with the suction aspirator.  The fundus was grasped and retracted cephalad. Adhesions were lysed bluntly and with the electrocautery where indicated, taking care not to injure any adjacent organs or viscus. The infundibulum was grasped and retracted laterally, exposing the peritoneum overlying the triangle of Calot. This was then divided and exposed in a blunt fashion. The cystic duct was clearly identified and bluntly dissected circumferentially. A critical view of the cystic duct and cystic artery was obtained.  The cystic duct was then ligated with clips and divided. The cystic artery was, dissected free, ligated with clips and divided as well.   The gallbladder was dissected from the liver bed in retrograde fashion with the electrocautery. The gallbladder was removed and placed in an Eco sac. The liver bed was irrigated and inspected. Hemostasis was achieved with the electrocautery. Copious irrigation was utilized and was repeatedly aspirated until clear.  The gallbladder and Eco sac were then removed through the umbilical port site.  The pursestring suture was used to close the umbilical fascia.    We again inspected the right upper quadrant for hemostasis.  Pneumoperitoneum was released as we removed the  trocars.  4-0 Monocryl was used to close the skin.  Benzoin, steri-strips, and clean dressings were applied. The patient was then extubated and brought to the recovery room in stable condition. Instrument, sponge, and needle counts were correct at closure and at the conclusion of the case.   Findings: Cholecystitis with Cholelithiasis  Estimated Blood Loss: less than 50 mL         Drains: none         Specimens: Gallbladder           Complications: None; patient tolerated the procedure well.         Disposition: PACU - hemodynamically stable.         Condition: stable  Dana ArmsMatthew K. Burnett Skainssuei, MD, [facility]FACS [facility]  General/ Trauma Surgery  07/14/2017 12:48 PM

## 2017-07-14 NOTE — Progress Notes (Signed)
Piercing noted to clitoris Dr Lynann Bolognausei informed states they will try to remove while pt is asleep.  OR nurse informed.

## 2017-07-14 NOTE — Anesthesia Procedure Notes (Signed)
Procedure Name: Intubation Date/Time: 07/14/2017 11:34 AM Performed by: Virgel GessHOLTZMAN, Captola Teschner LEFFEW Pre-anesthesia Checklist: Patient identified, Patient being monitored, Timeout performed, Emergency Drugs available and Suction available Patient Re-evaluated:Patient Re-evaluated prior to induction Oxygen Delivery Method: Circle System Utilized Preoxygenation: Pre-oxygenation with 100% oxygen Induction Type: IV induction Laryngoscope Size: Glidescope and 4 Grade View: Grade I Tube type: Oral Tube size: 7.5 mm Number of attempts: 1 Airway Equipment and Method: Stylet Placement Confirmation: ETT inserted through vocal cords under direct vision,  positive ETCO2 and breath sounds checked- equal and bilateral Secured at: 22 cm Tube secured with: Tape Dental Injury: Teeth and Oropharynx as per pre-operative assessment  Difficulty Due To: Difficulty was anticipated Future Recommendations: Recommend- induction with short-acting agent, and alternative techniques readily available

## 2017-07-14 NOTE — Transfer of Care (Signed)
Immediate Anesthesia Transfer of Care Note  Patient: Cyera A Selmer  Procedure(s) Performed: Procedure(s): LAPAROSCOPIC CHOLECYSTECTOMY WITH INTRAOPERATIVE CHOLANGIOGRAM (N/A)  Patient Location: PACU  Anesthesia Type:General  Level of Consciousness: awake, alert , oriented, patient cooperative and responds to stimulation  Airway & Oxygen Therapy: Patient Spontanous Breathing and Patient connected to face mask oxygen  Post-op Assessment: Report given to RN, Post -op Vital signs reviewed and stable and Patient moving all extremities X 4  Post vital signs: Reviewed and stable  Last Vitals:  Vitals:   07/14/17 0500 07/14/17 1305  BP: (!) 103/50   Pulse: 90 (P) 99  Resp: 16   Temp: 36.9 C   SpO2: 90% (P) 100%    Last Pain:  Vitals:   07/14/17 0500  TempSrc: Oral  PainSc:          Complications: No apparent anesthesia complications

## 2017-07-14 NOTE — Progress Notes (Signed)
Day of Surgery   Subjective/Chief Complaint: CC:  Still with some RUQ abdominal pain    Objective: Vital signs in last 24 hours: Temp:  [98.5 F (36.9 C)-99.3 F (37.4 C)] 98.5 F (36.9 C) (08/13 0500) Pulse Rate:  [88-101] 90 (08/13 0500) Resp:  [16-18] 16 (08/13 0500) BP: (100-119)/(38-64) 103/50 (08/13 0500) SpO2:  [90 %-100 %] 90 % (08/13 0500) Last BM Date: 07/10/17  Intake/Output from previous day: No intake/output data recorded. Intake/Output this shift: No intake/output data recorded.  General appearance: alert, cooperative and no distress GI: RUQ tenderness  Lab Results:   Recent Labs  07/12/17 2020 07/14/17 0205  WBC 11.1* 8.2  HGB 8.7* 7.6*  HCT 31.7* 28.6*  PLT 414* 357   BMET  Recent Labs  07/12/17 2020  NA 132*  K 3.6  CL 97*  CO2 26  GLUCOSE 114*  BUN 6  CREATININE 0.75  CALCIUM 8.9   Hepatic Function Latest Ref Rng & Units 07/14/2017 07/12/2017  Total Protein 6.5 - 8.1 g/dL 6.9 7.6  Albumin 3.5 - 5.0 g/dL 3.0(L) 3.4(L)  AST 15 - 41 U/L 15 20  ALT 14 - 54 U/L 11(L) 12(L)  Alk Phosphatase 38 - 126 U/L 51 62  Total Bilirubin 0.3 - 1.2 mg/dL 0.7 0.7  Bilirubin, Direct 0.1 - 0.5 mg/dL 0.1 -    PT/INR No results for input(s): LABPROT, INR in the last 72 hours. ABG No results for input(s): PHART, HCO3 in the last 72 hours.  Invalid input(s): PCO2, PO2  Studies/Results: Ct Abdomen Pelvis W Contrast  Result Date: 07/12/2017 CLINICAL DATA:  Acute onset of upper abdominal pain. Initial encounter. EXAM: CT ABDOMEN AND PELVIS WITH CONTRAST TECHNIQUE: Multidetector CT imaging of the abdomen and pelvis was performed using the standard protocol following bolus administration of intravenous contrast. CONTRAST:  ISOVUE-300 IOPAMIDOL (ISOVUE-300) INJECTION 61% COMPARISON:  None. FINDINGS: Lower chest: The visualized lung bases are grossly clear. The visualized portions of the mediastinum are unremarkable. Hepatobiliary: The liver is  unremarkable in appearance. Gallbladder wall thickening is noted, with mild pericholecystic fluid and surrounding soft tissue inflammation, concerning for acute cholecystitis. The common bile duct remains normal in caliber. Pancreas: The pancreas is within normal limits. Spleen: The spleen is unremarkable in appearance. Adrenals/Urinary Tract: The adrenal glands are unremarkable in appearance. The kidneys are within normal limits. There is no evidence of hydronephrosis. No renal or ureteral stones are identified. No perinephric stranding is seen. Stomach/Bowel: The stomach is unremarkable in appearance. The small bowel is within normal limits. The appendix is normal in caliber, without evidence of appendicitis. The colon is unremarkable in appearance. Vascular/Lymphatic: The abdominal aorta is unremarkable in appearance. The inferior vena cava is grossly unremarkable. No retroperitoneal lymphadenopathy is seen. No pelvic sidewall lymphadenopathy is identified. Reproductive: The bladder is mildly distended and grossly unremarkable. The uterus is unremarkable in appearance. The ovaries are relatively symmetric. Trace free fluid within the pelvis may be physiologic in nature. Other: No additional soft tissue abnormalities are seen. Musculoskeletal: No acute osseous abnormalities are identified. The visualized musculature is unremarkable in appearance. IMPRESSION: Gallbladder wall thickening, with mild pericholecystic fluid and surrounding soft tissue inflammation, concerning for acute cholecystitis. Electronically Signed   By: Roanna Raider M.D.   On: 07/12/2017 23:48   US Abdomen Limited Ruq  Result Date: 07/13/2017 CLINICAL DATA:  Acute onset of mid abdominal pain, nausea and vomiting. Initial encounter. EXAM: ULTRASOUND ABDOMEN LIMITED RIGHT UPPER QUADRANT COMPARISON:  CT of the abdomen and  pelvis performed earlier today at 11:35 p.m. FINDINGS: Gallbladder: There is diffuse gallbladder wall thickening, with  multiple stones measuring up to 1.4 cm in size. One of these is lodged at the neck of the gallbladder. No pericholecystic fluid is seen. No ultrasonographic Murphy's sign is elicited. Common bile duct: Diameter: 0.5 cm, within normal limits in caliber. Liver: No focal lesion identified. Within normal limits in parenchymal echogenicity. IMPRESSION: Diffuse gallbladder wall thickening, with multiple stones measuring up to 1.4 cm in size. One of these is lodged at the neck of the gallbladder, and may be causing intermittent obstruction. No ultrasonographic Murphy's sign is elicited, though given gallbladder wall thickening, mild cholecystitis is a concern. Electronically Signed   By: Garald Balding M.D.   On: 07/13/2017 00:17    Anti-infectives: Anti-infectives    Start     Dose/Rate Route Frequency Ordered Stop   07/13/17 0000  cefTRIAXone (ROCEPHIN) 2 g in dextrose 5 % 50 mL IVPB     2 g 100 mL/hr over 30 Minutes Intravenous  Once 07/12/17 2356 07/13/17 0142      Assessment/Plan: Plan laparoscopic cholecystectomy with intraoperative cholangiogram today.  The surgical procedure has been discussed with the patient.  Potential risks, benefits, alternative treatments, and expected outcomes have been explained.  All of the patient's questions at this time have been answered.  The likelihood of reaching the patient's treatment goal is good.  The patient understand the proposed surgical procedure and wishes to proceed.   LOS: 1 day    Tommy Minichiello K. 07/14/2017

## 2017-07-14 NOTE — Anesthesia Preprocedure Evaluation (Addendum)
Anesthesia Evaluation  Patient identified by MRN, date of birth, ID band Patient awake    Reviewed: Allergy & Precautions, NPO status , Patient's Chart, lab work & pertinent test results  Airway Mallampati: II  TM Distance: >3 FB Neck ROM: Full    Dental no notable dental hx.    Pulmonary neg pulmonary ROS,    Pulmonary exam normal breath sounds clear to auscultation       Cardiovascular negative cardio ROS Normal cardiovascular exam Rhythm:Regular Rate:Normal     Neuro/Psych negative neurological ROS  negative psych ROS   GI/Hepatic negative GI ROS, Neg liver ROS,   Endo/Other  Morbid obesity  Renal/GU negative Renal ROS     Musculoskeletal negative musculoskeletal ROS (+)   Abdominal (+) + obese,   Peds  Hematology  (+) anemia ,   Anesthesia Other Findings Super obese  hcg negative  Reproductive/Obstetrics                             Anesthesia Physical Anesthesia Plan  ASA: IV  Anesthesia Plan: General   Post-op Pain Management:    Induction: Intravenous  PONV Risk Score and Plan: 4 or greater and Ondansetron, Dexamethasone and Midazolam  Airway Management Planned: Oral ETT  Additional Equipment:   Intra-op Plan:   Post-operative Plan: Extubation in OR  Informed Consent: I have reviewed the patients History and Physical, chart, labs and discussed the procedure including the risks, benefits and alternatives for the proposed anesthesia with the patient or authorized representative who has indicated his/her understanding and acceptance.   Dental advisory given  Plan Discussed with: CRNA  Anesthesia Plan Comments:        Anesthesia Quick Evaluation

## 2017-07-15 ENCOUNTER — Encounter (HOSPITAL_COMMUNITY): Payer: Self-pay | Admitting: Surgery

## 2017-07-15 MED ORDER — OXYCODONE HCL 5 MG PO TABS: 5.0000 mg | ORAL_TABLET | ORAL | 0 refills | Status: DC | PRN

## 2017-07-15 NOTE — Discharge Summary (Signed)
  [facility] Discharge Summary   Patient ID: Dana FareRaven A Burnett MRN: [id number] DOB/AGE: 1991-08-18 26 y.o.  Admit date: 07/12/2017 Discharge date: 07/15/2017  Admitting Diagnosis: Cholelithiasis with acute cholecystitis   Discharge Diagnosis S/P cholecystectomy  Consultants None  Imaging: CT 07/12/17: Gallbladder wall thickening, with mild pericholecystic fluid and surrounding soft tissue inflammation, concerning for acute cholecystitis  RUQ US 07/13/17: Diffuse gallbladder wall thickening, with multiple stones measuring up to 1.4 cm in size. One of these is lodged at the neck of the gallbladder, and may be causing intermittent obstruction. No ultrasonographic Murphy's sign is elicited, though given gallbladder wall thickening, mild cholecystitis is a concern  Procedures Dr. Corliss Skainssuei (07/14/17) - Laparoscopic Cholecystectomy   Hospital Course:  Patient is a 26 y/o female who [facility] with abdominal pain, nausea and vomiting.  Workup showed cholelithiasis with acute cholecystitis.  Patient was admitted and underwent procedure listed above.  Tolerated procedure well and was transferred to the floor.  Diet was advanced as tolerated.  On POD#1, the patient was voiding well, tolerating diet, ambulating well, pain well controlled, vital signs stable, incisions c/d/i and felt stable for discharge home.  Patient will follow up in our office in 2 weeks and knows to call with questions or concerns. She will call to confirm appointment date/time.    Physical Exam: General:  Alert, NAD, pleasant, comfortable Resp: normal effort, CTAB Cardio: RRR, pedal pulses 2+ bilaterally Abd:  Soft, ND, mild tenderness, incisions C/D/I  I have personally looked this patient up in the [place] Controlled Substance Database and reviewed their medications.  Allergies as of 07/15/2017      Reactions   Shellfish Allergy Anaphylaxis      Medication List    STOP taking these medications    cephALEXin 500 MG capsule Commonly known as:  KEFLEX   ondansetron 4 MG tablet Commonly known as:  ZOFRAN   ranitidine 150 MG tablet Commonly known as:  ZANTAC     TAKE these medications   oxyCODONE 5 MG immediate release tablet Commonly known as:  Oxy IR/ROXICODONE Take 1 tablet (5 mg total) by mouth every 4 (four) hours as needed for moderate pain or severe pain.        Follow-up Information    [facility] [place] Go on 07/29/2017.   Specialty:  General Surgery Why:  at 9:45 AM for post-operative follow up. please arrive 30 minutes early. Contact information: [address] [place] [id number] [phone]          Signed: Wells GuilesKelly Rayburn , [facility] 07/15/2017, 10:13 AM Pager: [phone] Consults: [phone] Mon-Fri 7:00 am-4:30 pm Sat-Sun 7:00 am-11:30 am

## 2017-07-15 NOTE — Progress Notes (Signed)
Removed IV, provided discharge education/instructions, all questions and concerns addressed, patient not in distress, discharged home accompanied by her grandmother.

## 2017-07-15 NOTE — Discharge Instructions (Signed)
Your appointment is 07/29/17 at 9:45 AM for post-operative follow up. Please arrive at least 30 min before your appointment to complete your check in paperwork.  If you are unable to arrive 30 min prior to your appointment time we may have to cancel or reschedule you.  LAPAROSCOPIC SURGERY: POST OP INSTRUCTIONS  1. DIET: Follow a light bland diet the first 24 hours after arrival home, such as soup, liquids, crackers, etc. Be sure to include lots of fluids daily. Avoid fast food or heavy meals as your are more likely to get nauseated. Eat a low fat the next few days after surgery.  2. Take your usually prescribed home medications unless otherwise directed. 3. PAIN CONTROL:  1. Pain is best controlled by a usual combination of three different methods TOGETHER:  1. Ice/Heat 2. Over the counter pain medication 3. Prescription pain medication 2. Most patients will experience some swelling and bruising around the incisions. Ice packs or heating pads (30-60 minutes up to 6 times a day) will help. Use ice for the first few days to help decrease swelling and bruising, then switch to heat to help relax tight/sore spots and speed recovery. Some people prefer to use ice alone, heat alone, alternating between ice & heat. Experiment to what works for you. Swelling and bruising can take several weeks to resolve.  3. It is helpful to take an over-the-counter pain medication regularly for the first few weeks. Choose one of the following that works best for you:  1. Naproxen (Aleve, etc) Two 220mg  tabs twice a day 2. Ibuprofen (Advil, etc) Three 200mg  tabs four times a day (every meal & bedtime) 3. Acetaminophen (Tylenol, etc) 500-650mg  four times a day (every meal & bedtime) 4. A prescription for pain medication (such as oxycodone, hydrocodone, etc) should be given to you upon discharge. Take your pain medication as prescribed.  1. If you are having problems/concerns with the prescription medicine (does not control  pain, nausea, vomiting, rash, itching, etc), please call us [phone] to see if we need to switch you to a different pain medicine that will work better for you and/or control your side effect better. 2. If you need a refill on your pain medication, please contact your pharmacy. They will contact our office to request authorization. Prescriptions will not be filled after 5 pm or on week-ends. 4. Avoid getting constipated. Between the surgery and the pain medications, it is common to experience some constipation. Increasing fluid intake and taking a fiber supplement (such as Metamucil, Citrucel, FiberCon, MiraLax, etc) 1-2 times a day regularly will usually help prevent this problem from occurring. A mild laxative (prune juice, Milk of Magnesia, MiraLax, etc) should be taken according to package directions if there are no bowel movements after 48 hours. You can also take colace which is a stool softener up to twice a day.  5. Watch out for diarrhea. If you have many loose bowel movements, simplify your diet to bland foods & liquids for a few days. Stop any stool softeners and decrease your fiber supplement. Switching to mild anti-diarrheal medications (Kayopectate, Pepto Bismol) can help. If this worsens or does not improve, please call us. 6. Wash / shower every day. You may shower over the dressings as they are waterproof. Continue to shower over incision(s) after the dressing is off. 7. Remove your waterproof bandages 5 days after surgery. You may leave the incision open to air. You may replace a dressing/Band-Aid to cover the incision for comfort if  you wish.  8. ACTIVITIES as tolerated:  1. You may resume regular (light) daily activities beginning the next day--such as daily self-care, walking, climbing stairs--gradually increasing activities as tolerated. If you can walk 30 minutes without difficulty, it is safe to try more intense activity such as jogging, treadmill, bicycling, low-impact aerobics,  swimming, etc. 2. Save the most intensive and strenuous activity for last such as sit-ups, heavy lifting, contact sports, etc Refrain from any heavy lifting or straining until you are off narcotics for pain control.  3. DO NOT PUSH THROUGH PAIN. Let pain be your guide: If it hurts to do something, don't do it. Pain is your body warning you to avoid that activity for another week until the pain goes down. 4. You may drive when you are no longer taking prescription pain medication, you can comfortably wear a seatbelt, and you can safely maneuver your car and apply brakes. 5. You may have sexual intercourse when it is comfortable.  9. FOLLOW UP in our office  1. Please call [facility] at [phone] to set up an appointment to see your surgeon in the office for a follow-up appointment approximately 2-3 weeks after your surgery. 2. Make sure that you call for this appointment the day you arrive home to insure a convenient appointment time.      10. IF YOU HAVE DISABILITY OR FAMILY LEAVE FORMS, BRING THEM TO THE               OFFICE FOR PROCESSING.   WHEN TO CALL US [phone]:  1. Poor pain control 2. Reactions / problems with new medications (rash/itching, nausea, etc)  3. Fever over 101.5 F (38.5 C) 4. Inability to urinate 5. Nausea and/or vomiting 6. Worsening swelling or bruising 7. Continued bleeding from incision. 8. Increased pain, redness, or drainage from the incision  The clinic staff is available to answer your questions during regular business hours (8:30am-5pm). Please dont hesitate to call and ask to speak to one of our nurses for clinical concerns.  If you have a medical emergency, go to the nearest emergency room or call 911.  A [facility] from [facility] is always [facility] call at the [facility]   [facility] [place]  [address], [address], [place], [place] [address] ?  MAIN: [phone] ? TOLL FREE: [phone] ?  FAX [phone]    [email or web address]

## 2017-07-15 NOTE — Anesthesia Postprocedure Evaluation (Signed)
Anesthesia Post Note  Patient: Dana Burnett  Procedure(s) Performed: Procedure(s) (LRB): LAPAROSCOPIC CHOLECYSTECTOMY WITH INTRAOPERATIVE CHOLANGIOGRAM (N/A)     Patient location during evaluation: PACU Anesthesia Type: General Level of consciousness: awake and alert Pain management: pain level controlled Vital Signs Assessment: post-procedure vital signs reviewed and stable Respiratory status: spontaneous breathing, nonlabored ventilation, respiratory function stable and patient connected to nasal cannula oxygen Cardiovascular status: blood pressure returned to baseline and stable Postop Assessment: no signs of nausea or vomiting Anesthetic complications: no    Last Vitals:  Vitals:   07/14/17 2011 07/15/17 0536  BP: 111/74 114/63  Pulse: 100 86  Resp: 18 18  Temp: 37.1 C 36.7 C  SpO2: 100% 100%    Last Pain:  Vitals:   07/15/17 0555  TempSrc:   PainSc: 7                  Dana Burnett, Dana Burnett

## 2018-09-15 IMAGING — CT CT ABD-PELV W/ CM
2 of 4 series · 16 of 46 positions shown, 18 images · IV contrast (APPLIED)
Comparison: None.

CLINICAL DATA: Acute onset of upper abdominal pain. Initial
encounter.

EXAM:
CT ABDOMEN AND PELVIS WITH CONTRAST
TECHNIQUE: Multidetector CT imaging of the abdomen and pelvis was performed
using the standard protocol following bolus administration of
intravenous contrast.
CONTRAST:  100mL DHHRWM-6ZZ IOPAMIDOL (DHHRWM-6ZZ) INJECTION 61%

[Series 3: abd/ pelvis 5.0 i30f 2 · axial · 0.98mm/px · z∈[+853,+1293]mm · 13 of 96 slices shown, 15 images]
[im 4/96  soft-tissue]
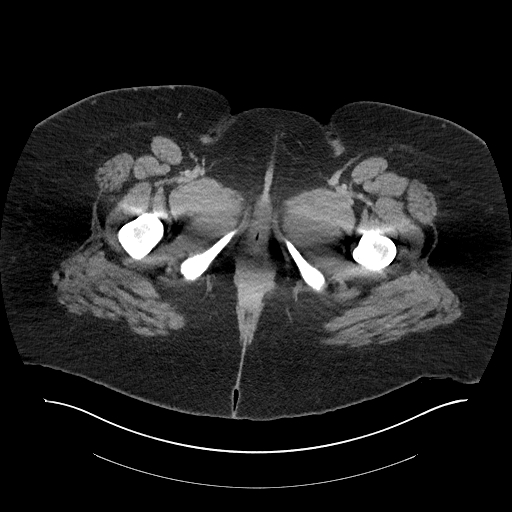
[im 4/96  bone]
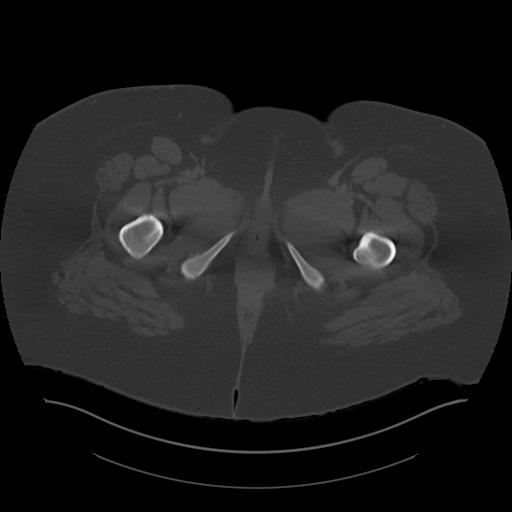
[im 12/96  soft-tissue]
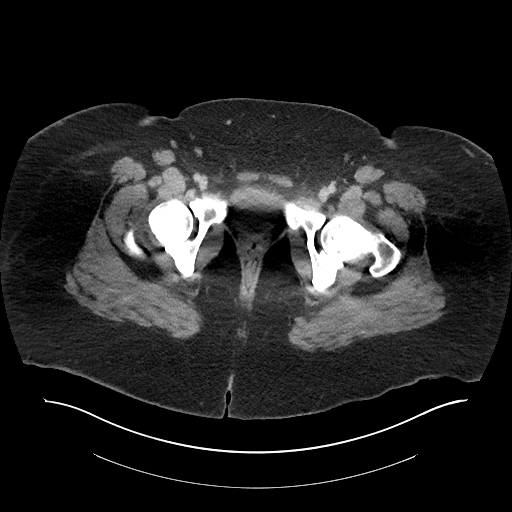
[im 20/96  soft-tissue]
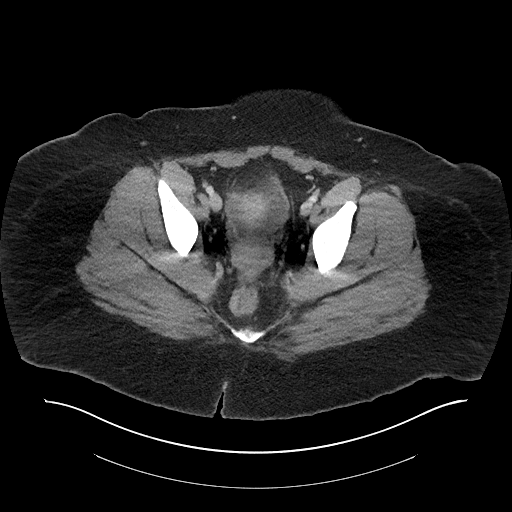
[im 28/96  soft-tissue]
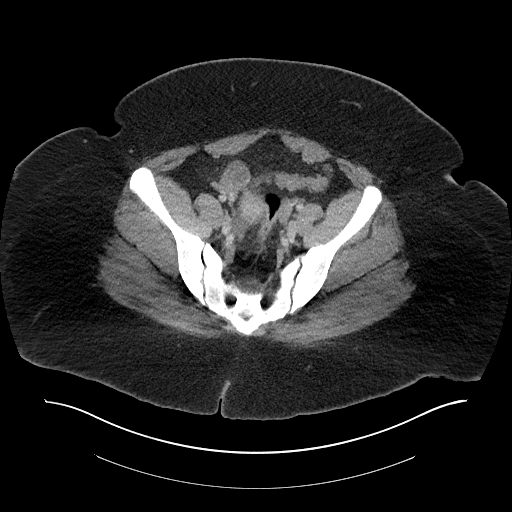
[im 32/96  soft-tissue]
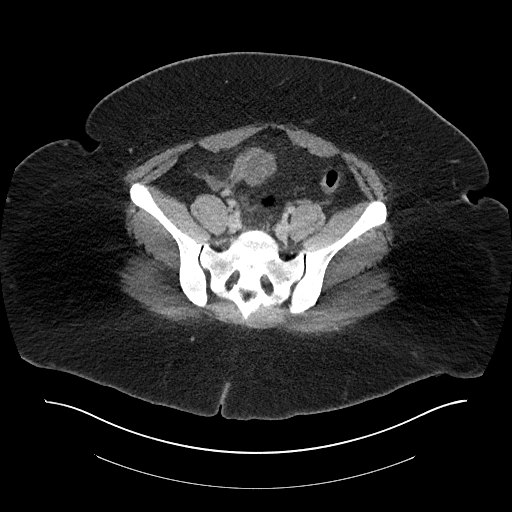
[im 40/96  soft-tissue]
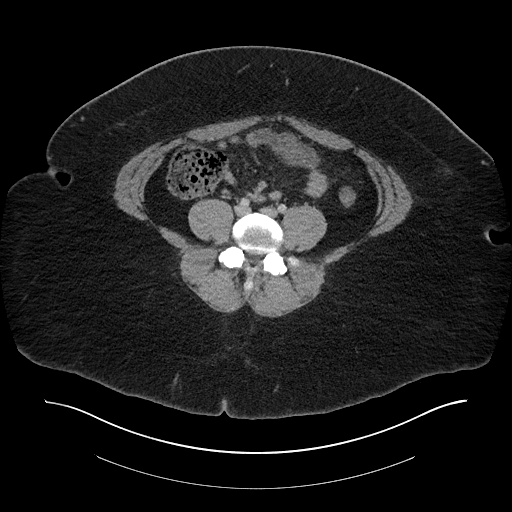
[im 48/96  soft-tissue]
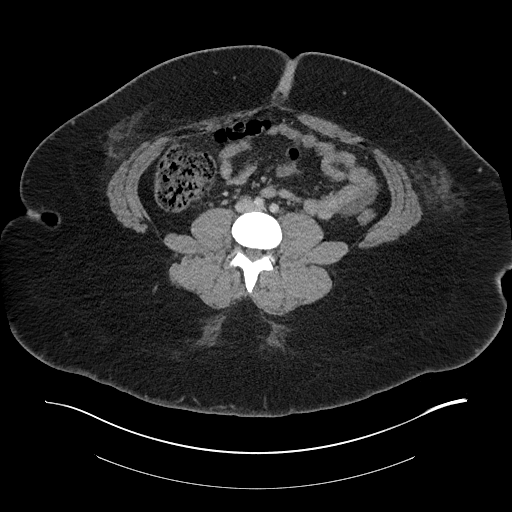
[im 56/96  soft-tissue]
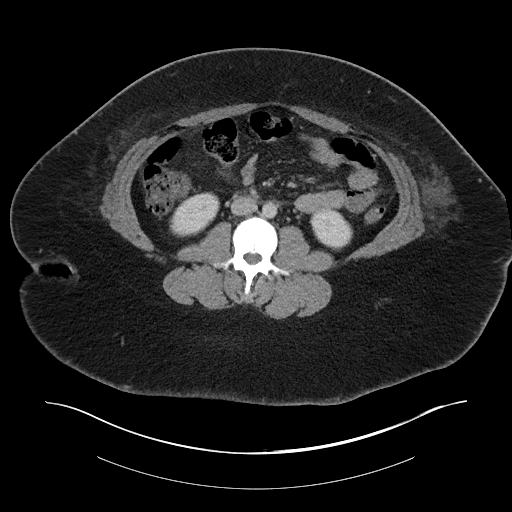
[im 64/96  soft-tissue]
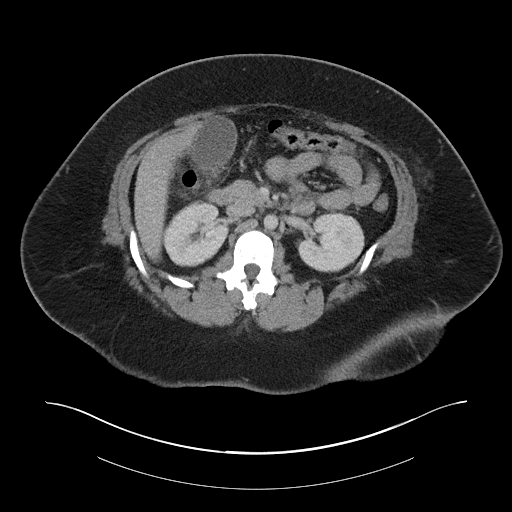
[im 64/96  bone]
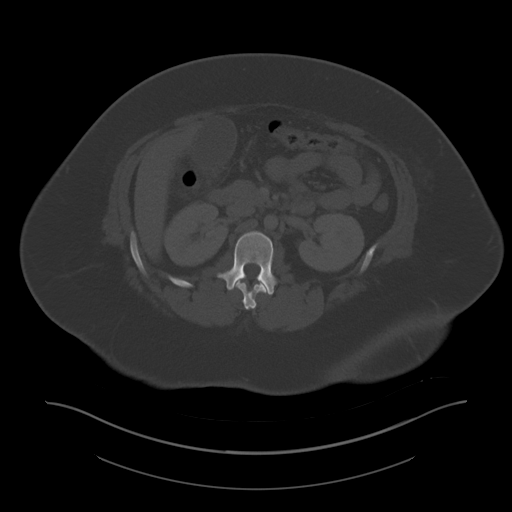
[im 68/96  soft-tissue]
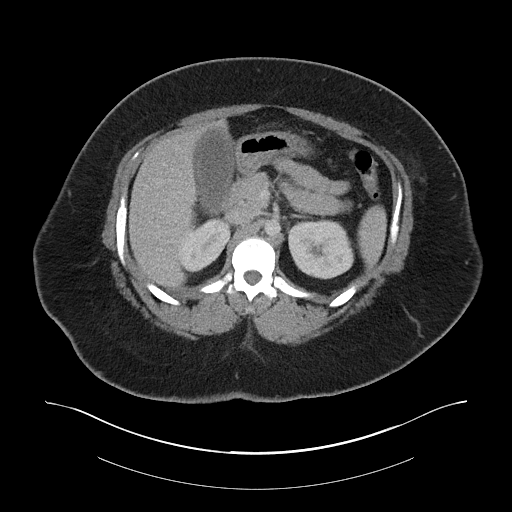
[im 76/96  soft-tissue]
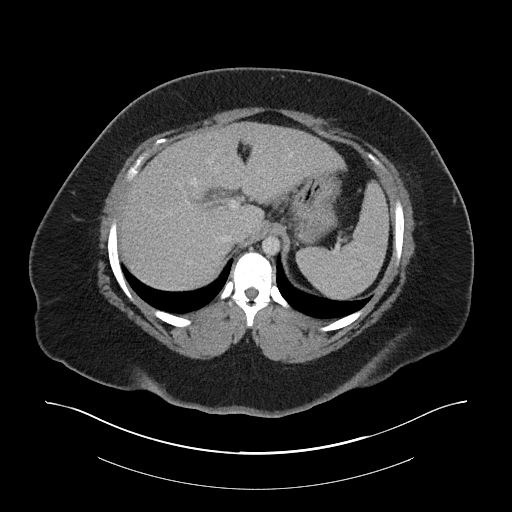
[im 84/96  soft-tissue]
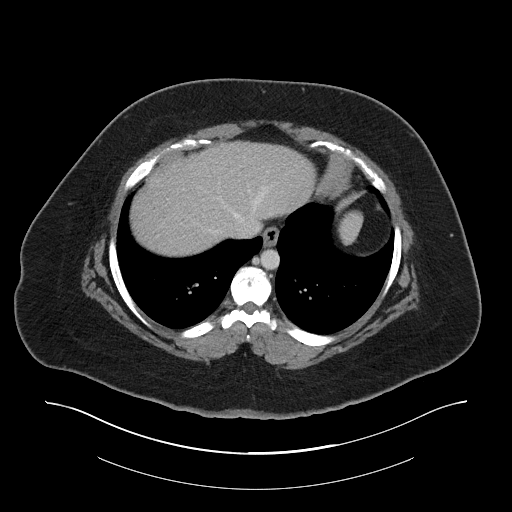
[im 92/96  soft-tissue]
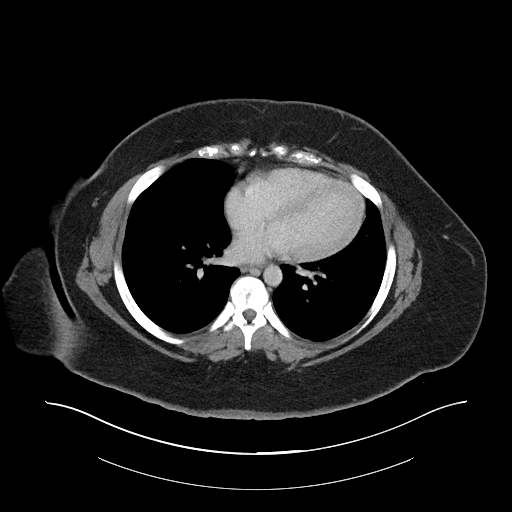

[Series 6: coronal soft tissue · coronal · 0.94mm/px · 3 of 131 slices shown]
[im 44/131  soft-tissue]
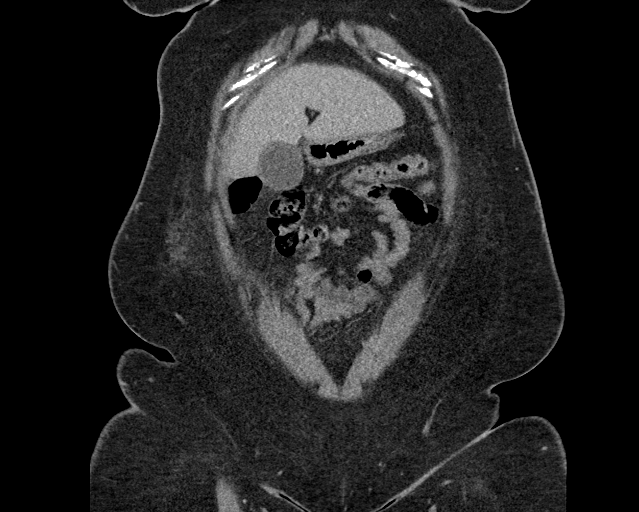
[im 58/131  soft-tissue]
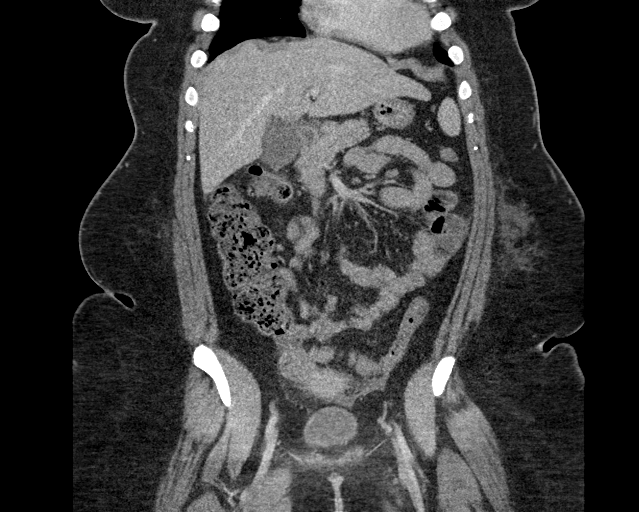
[im 73/131  soft-tissue]
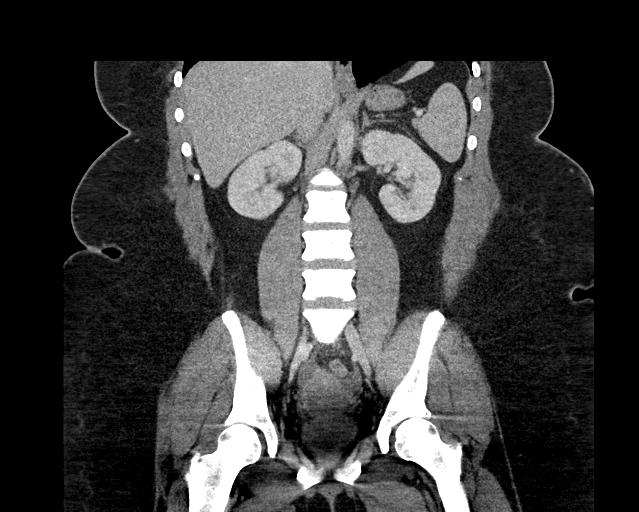

[16 of 46 positions shown; findings below may reference images not displayed]

FINDINGS: Lower chest: The visualized lung bases are grossly clear. The
visualized portions of the mediastinum are unremarkable.

Hepatobiliary: The liver is unremarkable in appearance.

Gallbladder wall thickening is noted, with mild pericholecystic
fluid and surrounding soft tissue inflammation, concerning for acute
cholecystitis. The common bile duct remains normal in caliber.

Pancreas: The pancreas is within normal limits.

Spleen: The spleen is unremarkable in appearance.

Adrenals/Urinary Tract: The adrenal glands are unremarkable in
appearance. The kidneys are within normal limits. There is no
evidence of hydronephrosis. No renal or ureteral stones are
identified. No perinephric stranding is seen.

Stomach/Bowel: The stomach is unremarkable in appearance. The small
bowel is within normal limits. The appendix is normal in caliber,
without evidence of appendicitis. The colon is unremarkable in
appearance.

Vascular/Lymphatic: The abdominal aorta is unremarkable in
appearance. The inferior vena cava is grossly unremarkable. No
retroperitoneal lymphadenopathy is seen. No pelvic sidewall
lymphadenopathy is identified.

Reproductive: The bladder is mildly distended and grossly
unremarkable. The uterus is unremarkable in appearance. The ovaries
are relatively symmetric. Trace free fluid within the pelvis may be
physiologic in nature.

Other: No additional soft tissue abnormalities are seen.

Musculoskeletal: No acute osseous abnormalities are identified. The
visualized musculature is unremarkable in appearance.
IMPRESSION: Gallbladder wall thickening, with mild pericholecystic fluid and
surrounding soft tissue inflammation, concerning for acute
cholecystitis.

## 2020-11-05 ENCOUNTER — Encounter (HOSPITAL_COMMUNITY): Payer: Self-pay

## 2020-11-05 ENCOUNTER — Emergency Department (HOSPITAL_COMMUNITY): Payer: HRSA Program

## 2020-11-05 ENCOUNTER — Emergency Department (HOSPITAL_COMMUNITY)
Admission: EM | Admit: 2020-11-05 | Discharge: 2020-11-06 | Disposition: A | Discharge status: Home / Self Care | Payer: HRSA Program | Attending: Emergency Medicine | Admitting: Emergency Medicine

## 2020-11-05 ENCOUNTER — Other Ambulatory Visit: Payer: Self-pay

## 2020-11-05 DIAGNOSIS — J1282 Pneumonia due to coronavirus disease 2019: Secondary | ICD-10-CM | POA: Diagnosis not present

## 2020-11-05 DIAGNOSIS — U071 COVID-19: Secondary | ICD-10-CM | POA: Diagnosis not present

## 2020-11-05 DIAGNOSIS — R059 Cough, unspecified: Secondary | ICD-10-CM | POA: Diagnosis present

## 2020-11-05 LAB — CBC WITH DIFFERENTIAL/PLATELET
Abs Immature Granulocytes: 0.01 10*3/uL (ref 0.00–0.07)
Basophils Absolute: 0 10*3/uL (ref 0.0–0.1)
Basophils Relative: 0 %
Eosinophils Absolute: 0 10*3/uL (ref 0.0–0.5)
Eosinophils Relative: 1 %
HCT: 36.5 % (ref 36.0–46.0)
Hemoglobin: 9.4 g/dL — ABNORMAL LOW (ref 12.0–15.0)
Immature Granulocytes: 0 %
Lymphocytes Relative: 40 %
Lymphs Abs: 1.8 10*3/uL (ref 0.7–4.0)
MCH: 16.4 pg — ABNORMAL LOW (ref 26.0–34.0)
MCHC: 25.8 g/dL — ABNORMAL LOW (ref 30.0–36.0)
MCV: 63.7 fL — ABNORMAL LOW (ref 80.0–100.0)
Monocytes Absolute: 0.4 10*3/uL (ref 0.1–1.0)
Monocytes Relative: 8 %
Neutro Abs: 2.3 10*3/uL (ref 1.7–7.7)
Neutrophils Relative %: 51 %
Platelets: 255 10*3/uL (ref 150–400)
RBC: 5.73 MIL/uL — ABNORMAL HIGH (ref 3.87–5.11)
RDW: 23.4 % — ABNORMAL HIGH (ref 11.5–15.5)
WBC: 4.4 10*3/uL (ref 4.0–10.5)
nRBC: 0 % (ref 0.0–0.2)

## 2020-11-05 LAB — COMPREHENSIVE METABOLIC PANEL
ALT: 54 U/L — ABNORMAL HIGH (ref 0–44)
AST: 86 U/L — ABNORMAL HIGH (ref 15–41)
Albumin: 3.3 g/dL — ABNORMAL LOW (ref 3.5–5.0)
Alkaline Phosphatase: 67 U/L (ref 38–126)
Anion gap: 9 (ref 5–15)
BUN: 9 mg/dL (ref 6–20)
CO2: 27 mmol/L (ref 22–32)
Calcium: 8.3 mg/dL — ABNORMAL LOW (ref 8.9–10.3)
Chloride: 100 mmol/L (ref 98–111)
Creatinine, Ser: 0.79 mg/dL (ref 0.44–1.00)
GFR, Estimated: >60 mL/min (ref >60)
Glucose, Bld: 128 mg/dL — ABNORMAL HIGH (ref 70–99)
Potassium: 4.8 mmol/L (ref 3.5–5.1)
Sodium: 136 mmol/L (ref 135–145)
Total Bilirubin: 0.7 mg/dL (ref 0.3–1.2)
Total Protein: 7.5 g/dL (ref 6.5–8.1)

## 2020-11-05 MED ORDER — BENZONATATE 100 MG PO CAPS
100.0000 mg | ORAL_CAPSULE | Freq: Once | ORAL | Status: AC
  Administered 2020-11-06: 100 mg via ORAL
  Filled 2020-11-05: qty 1

## 2020-11-05 MED ORDER — BENZONATATE 100 MG PO CAPS: 100.0000 mg | ORAL_CAPSULE | Freq: Three times a day (TID) | ORAL | 0 refills | Status: DC | PRN

## 2020-11-05 NOTE — Discharge Instructions (Addendum)
Thank you for allowing [place] to care for you today.   Please return to the emergency department if you have any new or worsening symptoms.  Your covid test is in process, should result in 2-4 hours. You will receive a phone call if positive. The results will also be available in your MyChart.  Medications- You can take medications to help treat your symptoms: -Tylenol for fever and body aches. Please take as prescribed on the bottle. -Over the coutner cough medicine such as mucinex, robitussin, or other brands. -Flonase or saline nasal spray for nasal congestion -Vitamins as recommended by CDC  Treatment- This is a virus and unfortunately there are no antibitotics approved to treat this virus at this time. It is important to monitor your symptoms closely: -You should have a theremometer at home to check your temperature when feeling feverish. -Use a pulse ox meter to measure your oxygen when feeling short of breath.  -If your fever is over 100.4 despite taking tylenol or if your oxygen level drops below 94% these are reasons to return to the emergency department for further evaluation.   -You need to quarantine for 10 days starting today.  You can return to work, school or normal activities if on day 10 you are fever free without the use of Tylenol or ibuprofen.  You will need to continue quarantine if you still have a fever over 100.4.  Again: symptoms of shortness of breath, chest pain, difficulty breathing, new onset of confusion, any symptoms that are concerning. If any of these symptoms you should come to emergency department for evaluation.   I hope you feel better soon

## 2020-11-05 NOTE — ED Triage Notes (Signed)
Patient arrived stating she was diagnosed with Covid-19 on 11/29. Reports excessive coughing, taking Nyquil and elderberry with no relief.

## 2020-11-05 NOTE — ED Provider Notes (Signed)
[facility]-EMERGENCY DEPT Provider Note   CSN: [id number] Arrival date & time: 11/05/20  2104     History Chief Complaint  Patient presents with  . Cough    Dana Burnett is a 29 y.o. female with noncontributory past medical history. Did not have covid vaccinations.   HPI Patient reports to emergency department today with chief complaint of cough x 1 week. She had home positive covid test day of symptom onset. Her cough is nonproductive. She admits to feeling short of breath when walking long distances. She has decreased appetite and is trying to stay well hydrated, drinking 4-6 bottles of water/day. She has tried nyquil and elderberry with minimal symptom relief. She has been checking her temperature daily and reports tmax of 99.0. She denies chills, congestions, chest pain, abdominal pain, nausea, emesis.     History reviewed. No pertinent past medical history.  Patient Active Problem List   Diagnosis Date Noted  . Cholecystitis, acute with cholelithiasis 07/13/2017    Past Surgical History:  Procedure Laterality Date  . CHOLECYSTECTOMY N/A 07/14/2017   Procedure: LAPAROSCOPIC CHOLECYSTECTOMY WITH INTRAOPERATIVE CHOLANGIOGRAM;  Surgeon: Manus Rudd, MD;  Location: [facility] OR;  Service: General;  Laterality: N/A;     OB History   No obstetric history on file.     No family history on file.  Social History   Tobacco Use  . Smoking status: Never Smoker  . Smokeless tobacco: Never Used  Substance Use Topics  . Alcohol use: No  . Drug use: No    Home Medications Prior to Admission medications   Medication Sig Start Date End Date Taking? Authorizing Provider  benzonatate (TESSALON) 100 MG capsule Take 1 capsule (100 mg total) by mouth every 8 (eight) hours as needed for cough. 11/05/20   Walisiewicz, Yvonna Alanis E, PA-C  oxyCODONE (OXY IR/ROXICODONE) 5 MG immediate release tablet Take 1 tablet (5 mg total) by mouth every 4 (four) hours as needed for  moderate pain or severe pain. 07/15/17   Juliet Rude, PA-C    Allergies    Shellfish allergy  Review of Systems   Review of Systems All other systems are reviewed and are negative for acute change except as noted in the HPI.  Physical Exam Updated Vital Signs BP 139/87 (BP Location: Right Arm)   Pulse (!) 102   Temp 98.7 F (37.1 C) (Oral)   Resp 18   LMP 10/15/2020   SpO2 97%   Physical Exam Vitals and nursing note reviewed.  Constitutional:      General: She is not in acute distress.    Appearance: She is obese. She is not ill-appearing.  HENT:     Head: Normocephalic and atraumatic.     Right Ear: Tympanic membrane and external ear normal.     Left Ear: Tympanic membrane and external ear normal.     Nose: Nose normal.     Mouth/Throat:     Mouth: Mucous membranes are moist.     Pharynx: Oropharynx is clear.  Eyes:     General: No scleral icterus.       Right eye: No discharge.        Left eye: No discharge.     Extraocular Movements: Extraocular movements intact.     Conjunctiva/sclera: Conjunctivae normal.     Pupils: Pupils are equal, round, and reactive to light.  Neck:     Vascular: No JVD.  Cardiovascular:     Rate and Rhythm: Normal rate and  regular rhythm.     Pulses: Normal pulses.          Radial pulses are 2+ on the right side and 2+ on the left side.     Heart sounds: Normal heart sounds.  Pulmonary:     Comments: Lungs clear to auscultation in all fields. Symmetric chest rise. No wheezing, rales, or rhonchi.  Oxygen saturation is 97% on room air during exam.  She is talking in full sentences.  No respiratory distress. Abdominal:     Comments: Abdomen is soft, non-distended, and non-tender in all quadrants. No rigidity, no guarding. No peritoneal signs.  Musculoskeletal:        General: Normal range of motion.     Cervical back: Normal range of motion.     Comments: Homans sign absent bilaterally, no lower extremity edema, no palpable cords,  compartments are soft  Skin:    General: Skin is warm and dry.     Capillary Refill: Capillary refill takes less than 2 seconds.  Neurological:     Mental Status: She is oriented to person, place, and time.     GCS: GCS eye subscore is 4. GCS verbal subscore is 5. GCS motor subscore is 6.     Comments: Fluent speech, no facial droop.  Psychiatric:        Behavior: Behavior normal.     ED Results / Procedures / Treatments   Labs (all labs ordered are listed, but only abnormal results are displayed) Labs Reviewed  CBC WITH DIFFERENTIAL/PLATELET - Abnormal; Notable for the following components:      Result Value   RBC 5.73 (*)    Hemoglobin 9.4 (*)    MCV 63.7 (*)    MCH 16.4 (*)    MCHC 25.8 (*)    RDW 23.4 (*)    All other components within normal limits  COMPREHENSIVE METABOLIC PANEL - Abnormal; Notable for the following components:   Glucose, Bld 128 (*)    Calcium 8.3 (*)    Albumin 3.3 (*)    AST 86 (*)    ALT 54 (*)    All other components within normal limits  RESP PANEL BY RT-PCR (FLU A&B, COVID) ARPGX2    EKG None  Radiology DG Chest 2 View  Result Date: 11/05/2020 CLINICAL DATA:  Cough, COVID-19 positive EXAM: CHEST - 2 VIEW COMPARISON:  None. FINDINGS: Consolidative opacity is present in the left lung base partially silhouetting the left hemidiaphragm. Additional hazy and patchy opacities present the right perihilar space as well. Diffuse airways thickening. No pneumothorax. No effusion. The cardiomediastinal contours are unremarkable. No acute osseous or soft tissue abnormality. IMPRESSION: Findings compatible with multifocal pneumonia in the setting of COVID-19. Electronically Signed   By: Kreg Shropshire M.D.   On: 11/05/2020 21:30    Procedures Procedures (including critical care time)  Medications Ordered in ED Medications  benzonatate (TESSALON) capsule 100 mg (has no administration in time range)    ED Course  I have reviewed the triage vital signs  and the nursing notes.  Pertinent labs & imaging results that were available during my care of the patient were reviewed by me and considered in my medical decision making (see chart for details).    MDM Rules/Calculators/A&P                          History provided by patient with additional history obtained from chart review.     Symptoms  and exam most suggestive of uncomplicated viral illness. DDX incluldes viral URI/LRI, COVID-19.  Exam is benign.  Normal WOB. No fever, tachypnea, hypoxemia. Lungs are CTAB. She was noted to be tachycardic to 118 in triage. On me exam heart rate is ranging from 99-104. Chest xray viewed by me shows multifocal pneumonia. Doubt bacterial pneumonia as she is afebrile and has been closely monitoring temperature at home. It is reassuring she has no leukocytosis on labs today.  I ambulated patient and she did so without tachycardia, hypoxia, or tachypnea. Oxygen saturation stayed >95% on room air during ambulation. On exam she has no clinical signs of severe illness, dehydration. CBC with hemoglobin 9.4, which is consistent with her baseline on chart review. CMP shows no significant electrolyte derangement, mild transaminitis. No abdominal tenderness on exam. Covid test is in process, she had positive test at home. Patient ambulated in the emergency department without respiratory distress or hypoxia, SpO2 >95% on room air.  Given reassuring physical exam, symptoms, will discharge with symptomatic treatment including incentive spirometer and tessalon perles. Recommend telemedicine PCP f/u in the next 2-3 days for persistent symptoms  for further guidance. Self-isolation instructions discussed. Pt was given home self-isolation instructions and instructions for family members.   The patient appears reasonably screened and/or stabilized for discharge and I doubt any other medical condition or other [facility] requiring further screening, evaluation, or treatment in the ED at  this time prior to discharge. The patient is safe for discharge with strict return precautions discussed.   Avry A Fauteux was evaluated in Emergency Department on 11/05/2020 for the symptoms described in the history of present illness. She was evaluated in the context of the global COVID-19 pandemic, which necessitated consideration that the patient might be at risk for infection with the SARS-CoV-2 virus that causes COVID-19. Institutional protocols and algorithms that pertain to the evaluation of patients at risk for COVID-19 are in a state of rapid change based on information released by regulatory bodies including the CDC and federal and state organizations. These policies and algorithms were followed during the patient's care in the ED.   Portions of this note were generated with Scientist, clinical (histocompatibility and immunogenetics). Dictation errors may occur despite best attempts at proofreading.    Final Clinical Impression(s) / ED Diagnoses Final diagnoses:  Pneumonia due to COVID-19 virus    Rx / DC Orders ED Discharge Orders         Ordered    benzonatate (TESSALON) 100 MG capsule  Every 8 hours PRN        11/05/20 2339           Shanon Ace, PA-C 11/05/20 2340    Cathren Laine, MD 11/06/20 1538

## 2020-11-05 NOTE — ED Notes (Signed)
Pt's 02 was 99-100 while walking.

## 2020-11-06 ENCOUNTER — Telehealth: Payer: Self-pay | Admitting: Nurse Practitioner

## 2020-11-06 LAB — RESP PANEL BY RT-PCR (FLU A&B, COVID) ARPGX2
Influenza A by PCR: NEGATIVE
Influenza B by PCR: NEGATIVE
SARS Coronavirus 2 by RT PCR: POSITIVE — AB

## 2020-11-06 NOTE — Telephone Encounter (Signed)
Called to discuss with Tabbetha A Hoard about Covid symptoms and the use of the monoclonal antibody infusion for those with mild to moderate Covid symptoms and at a high risk of hospitalization.    Pt does not qualify for infusion therapy as her symptoms first presented > 10 days prior to timing of infusion. (10/28/20) Symptoms tier reviewed as well as criteria for ending isolation. Preventative practices reviewed. Patient verbalized understanding     Patient Active Problem List   Diagnosis Date Noted  . Cholecystitis, acute with cholelithiasis 07/13/2017    Willette Alma, AGPCNP-BC

## 2020-11-09 ENCOUNTER — Ambulatory Visit (INDEPENDENT_AMBULATORY_CARE_PROVIDER_SITE_OTHER): Payer: HRSA Program | Admitting: Nurse Practitioner

## 2020-11-09 VITALS — BP 132/88 | HR 106 | Temp 97.3°F | Ht 61.0 in | Wt 390.0 lb

## 2020-11-09 DIAGNOSIS — U071 COVID-19: Secondary | ICD-10-CM | POA: Diagnosis not present

## 2020-11-09 DIAGNOSIS — J1282 Pneumonia due to coronavirus disease 2019: Secondary | ICD-10-CM

## 2020-11-09 MED ORDER — PREDNISONE 20 MG PO TABS: 20.0000 mg | ORAL_TABLET | Freq: Every day | ORAL | 0 refills | Status: AC

## 2020-11-09 MED ORDER — AZITHROMYCIN 250 MG PO TABS: ORAL_TABLET | ORAL | 0 refills | Status: DC

## 2020-11-09 NOTE — Progress Notes (Signed)
@Patient  ID: Newborn, female    DOB: 1991/03/22, 29 y.o.   MRN: [id number]  Chief Complaint  Patient presents with  . Covid Positive    At home test positive 11/29, ED on 12/5 xray showing Pneumonia due to COVID    Referring provider: No ref. provider found   29 year old female with no significant health history other than obesity.  HPI  Patient presents today for post COVID care clinic visit.  Patient tested positive for Covid at home on 10/30/2020.  She was seen in the ED on 11/05/2020 and was diagnosed with Covid pneumonia.  Patient was treated with 14/04/2020.  She states that she is slowly improving but does still have shortness of breath with exertion.  Denies f/c/s, n/v/d, hemoptysis, PND, chest pain or edema.      Allergies  Allergen Reactions  . Shellfish Allergy Anaphylaxis     There is no immunization history on file for this patient.  History reviewed. No pertinent past medical history.  Tobacco History: Social History   Tobacco Use  Smoking Status Never Smoker  Smokeless Tobacco Never Used   Counseling given: Not Answered   Outpatient Encounter Medications as of 11/09/2020  Medication Sig  . benzonatate (TESSALON) 100 MG capsule Take 1 capsule (100 mg total) by mouth every 8 (eight) hours as needed for cough.  14/08/2020 azithromycin (ZITHROMAX) 250 MG tablet Take 2 tablets (500 mg) on day 1, then take 1 tablet (250 mg) on days 2-5  . predniSONE (DELTASONE) 20 MG tablet Take 1 tablet (20 mg total) by mouth daily with breakfast for 5 days.  . [DISCONTINUED] oxyCODONE (OXY IR/ROXICODONE) 5 MG immediate release tablet Take 1 tablet (5 mg total) by mouth every 4 (four) hours as needed for moderate pain or severe pain. (Patient not taking: Reported on 11/09/2020)   No facility-administered encounter medications on file as of 11/09/2020.     Review of Systems  Review of Systems  Constitutional: Negative.  Negative for fatigue and fever.  HENT: Negative.    Respiratory: Positive for shortness of breath. Negative for cough.   Cardiovascular: Negative.  Negative for chest pain, palpitations and leg swelling.  Gastrointestinal: Negative.   Allergic/Immunologic: Negative.   Neurological: Negative.   Psychiatric/Behavioral: Negative.        Physical Exam  BP 132/88 (BP Location: Left Arm)   Pulse (!) 106   Temp (!) 97.3 F (36.3 C)   Ht 5\' 1"  (1.549 m)   Wt (!) 390 lb (176.9 kg)   LMP 10/15/2020   SpO2 93%   BMI 73.69 kg/m   Wt Readings from Last 5 Encounters:  11/09/20 (!) 390 lb (176.9 kg)  07/12/17 (!) 321 lb 3 oz (145.7 kg)     Physical Exam Vitals and nursing note reviewed.  Constitutional:      General: She is not in acute distress.    Appearance: She is well-developed and well-nourished.  Cardiovascular:     Rate and Rhythm: Normal rate and regular rhythm.  Pulmonary:     Effort: Pulmonary effort is normal.     Breath sounds: Normal breath sounds.  Musculoskeletal:     Right lower leg: No edema.     Left lower leg: No edema.  Neurological:     Mental Status: She is alert and oriented to person, place, and time.  Psychiatric:        Mood and Affect: Mood and affect and mood normal.  Behavior: Behavior normal.       Imaging: DG Chest 2 View  Result Date: 11/05/2020 CLINICAL DATA:  Cough, COVID-19 positive EXAM: CHEST - 2 VIEW COMPARISON:  None. FINDINGS: Consolidative opacity is present in the left lung base partially silhouetting the left hemidiaphragm. Additional hazy and patchy opacities present the right perihilar space as well. Diffuse airways thickening. No pneumothorax. No effusion. The cardiomediastinal contours are unremarkable. No acute osseous or soft tissue abnormality. IMPRESSION: Findings compatible with multifocal pneumonia in the setting of COVID-19. Electronically Signed   By: Kreg Shropshire M.D.   On: 11/05/2020 21:30     Assessment & Plan:   Pneumonia due to COVID-19 virus Stay well  hydrated  Stay active  Deep breathing exercises  May take tylenol or fever or pain  May take mucinex twice daily  Will order prednisone  Will order azithromyxcin     Follow up:  Follow up in 4 weeks or sooner if needed - follow up chest xray      Ivonne Andrew, NP 11/09/2020

## 2020-11-09 NOTE — Patient Instructions (Addendum)
Covid 19 pneumonia:   Stay well hydrated  Stay active  Deep breathing exercises  May take tylenol or fever or pain  May take mucinex twice daily  Will order prednisone  Will order azithromyxcin     Follow up:  Follow up in 4 weeks or sooner if needed - follow up chest xray

## 2020-11-09 NOTE — Assessment & Plan Note (Signed)
Stay well hydrated  Stay active  Deep breathing exercises  May take tylenol or fever or pain  May take mucinex twice daily  Will order prednisone  Will order azithromyxcin     Follow up:  Follow up in 4 weeks or sooner if needed - follow up chest xray

## 2020-12-07 ENCOUNTER — Ambulatory Visit: Payer: Medicaid Other

## 2020-12-12 ENCOUNTER — Ambulatory Visit: Payer: Medicaid Other | Admitting: Nurse Practitioner

## 2021-10-19 ENCOUNTER — Encounter (HOSPITAL_COMMUNITY): Payer: Self-pay | Admitting: Emergency Medicine

## 2021-10-19 ENCOUNTER — Emergency Department (HOSPITAL_COMMUNITY)
Admission: EM | Admit: 2021-10-19 | Discharge: 2021-10-19 | Disposition: A | Discharge status: Home / Self Care | Payer: Medicaid Other | Attending: Emergency Medicine | Admitting: Emergency Medicine

## 2021-10-19 ENCOUNTER — Other Ambulatory Visit: Payer: Self-pay

## 2021-10-19 DIAGNOSIS — Z20822 Contact with and (suspected) exposure to covid-19: Secondary | ICD-10-CM | POA: Insufficient documentation

## 2021-10-19 DIAGNOSIS — Z8616 Personal history of COVID-19: Secondary | ICD-10-CM | POA: Insufficient documentation

## 2021-10-19 DIAGNOSIS — B349 Viral infection, unspecified: Secondary | ICD-10-CM | POA: Insufficient documentation

## 2021-10-19 LAB — RESP PANEL BY RT-PCR (FLU A&B, COVID) ARPGX2
Influenza A by PCR: NEGATIVE
Influenza B by PCR: NEGATIVE
SARS Coronavirus 2 by RT PCR: NEGATIVE

## 2021-10-19 NOTE — Discharge Instructions (Signed)
You likely have a viral illness.  This should be treated symptomatically. Use Tylenol or ibuprofen as needed for headache, fevers, sore throat, or body aches. Use cough drops/syrup as needed.  Make sure you stay well-hydrated with water. Wash your hands frequently to prevent spread of infection. Follow-up with your primary care doctor in 1 week if your symptoms are not improving. Return to the emergency room if you develop chest pain, difficulty breathing, or any new or worsening symptoms.  Follow up on MyChart to see the results of your covid and flu test

## 2021-10-19 NOTE — ED Provider Notes (Signed)
Emergency Medicine Provider Triage Evaluation Note  Dana Burnett , a 30 y.o. female  was evaluated in triage.  Pt complains of sore throat and nasal congestion.  She reports this has been present for two days.  No coughing.  No fevers.  This morning she took dayquil but nothing since.    Review of Systems  Positive: Sore throat, nasal congestion Negative: Fevers.    Physical Exam  BP (!) 168/110 (BP Location: Right Arm)   Pulse (!) 103   Temp 98 F (36.7 C) (Oral)   Resp 20   Ht 5\' 1"  (1.549 m)   SpO2 99%   BMI 73.69 kg/m  Gen:   Awake, no distress   Resp:  Normal effort  MSK:   Moves extremities without difficulty  Other:  Throat with out exudates. Normal phonation  Medical Decision Making  Medically screening exam initiated at 9:42 PM.  Appropriate orders placed.  Dana Burnett was informed that the remainder of the evaluation will be completed by another provider, this initial triage assessment does not replace that evaluation, and the importance of remaining in the ED until their evaluation is complete.  Note: Portions of this report may have been transcribed using voice recognition software. Every effort was made to ensure accuracy; however, inadvertent computerized transcription errors may be present    10/19/21 2147    2148, MD 10/19/21 [phone]

## 2021-10-19 NOTE — ED Provider Notes (Signed)
[facility]-EMERGENCY DEPT Provider Note   CSN: [id number] Arrival date & time: 10/19/21  2133     History Chief Complaint  Patient presents with   Sore Throat   Nasal Congestion    Dana Burnett is a 30 y.o. female presenting for evaluation of nasal congestion, sore throat, headache.  Patient states she has had symptoms for 2 days.  She reports her partner has similar symptoms.  No fever.  No cough, chest pain, shortness breath.  No nausea, vomiting, diarrhea.  No other medical problems, takes no medications daily.  HPI     History reviewed. No pertinent past medical history.  Patient Active Problem List   Diagnosis Date Noted   Pneumonia due to COVID-19 virus 11/09/2020   Cholecystitis, acute with cholelithiasis 07/13/2017    Past Surgical History:  Procedure Laterality Date   CHOLECYSTECTOMY N/A 07/14/2017   Procedure: LAPAROSCOPIC CHOLECYSTECTOMY WITH INTRAOPERATIVE CHOLANGIOGRAM;  Surgeon: Manus Rudd, MD;  Location: [facility] OR;  Service: General;  Laterality: N/A;   TOOTH EXTRACTION       OB History   No obstetric history on file.     Family History  Problem Relation Age of Onset   Diabetes Maternal Grandfather    Hypertension Maternal Grandfather    Cancer Paternal Grandmother    Diabetes Paternal Grandmother    Hypertension Paternal Grandmother     Social History   Tobacco Use   Smoking status: Never   Smokeless tobacco: Never  Substance Use Topics   Alcohol use: No   Drug use: No    Home Medications Prior to Admission medications   Medication Sig Start Date End Date Taking? Authorizing Provider  azithromycin (ZITHROMAX) 250 MG tablet Take 2 tablets (500 mg) on day 1, then take 1 tablet (250 mg) on days 2-5 11/09/20   Ivonne Andrew, NP  benzonatate (TESSALON) 100 MG capsule Take 1 capsule (100 mg total) by mouth every 8 (eight) hours as needed for cough. 11/05/20   Shanon Ace, PA-C    Allergies    Shellfish  allergy  Review of Systems   Review of Systems  HENT:  Positive for congestion and sore throat.   Neurological:  Positive for headaches.   Physical Exam Updated Vital Signs BP (!) 168/110 (BP Location: Right Arm)   Pulse (!) 103   Temp 98 F (36.7 C) (Oral)   Resp 20   Ht 5\' 1"  (1.549 m)   SpO2 99%   BMI 73.69 kg/m   Physical Exam Vitals and nursing note reviewed.  Constitutional:      General: She is not in acute distress.    Appearance: Normal appearance. She is obese.     Comments: nontoxic  HENT:     Head: Normocephalic and atraumatic.     Right Ear: Tympanic membrane, ear canal and external ear normal.     Left Ear: Tympanic membrane, ear canal and external ear normal.     Nose: Mucosal edema and congestion present.     Mouth/Throat:     Pharynx: Oropharynx is clear. Uvula midline. No oropharyngeal exudate or posterior oropharyngeal erythema.     Comments: OP clear without tonsillar swelling or exudates.  Uvula midline. Eyes:     Extraocular Movements: Extraocular movements intact.     Conjunctiva/sclera: Conjunctivae normal.     Pupils: Pupils are equal, round, and reactive to light.  Cardiovascular:     Rate and Rhythm: Normal rate and regular rhythm.  Pulses: Normal pulses.  Pulmonary:     Effort: Pulmonary effort is normal.     Breath sounds: Normal breath sounds. No decreased breath sounds, wheezing, rhonchi or rales.     Comments: Speaking in full sentences.  Clear lung sounds in all fields.  Sats stable on room air. Abdominal:     General: There is no distension.     Palpations: Abdomen is soft.     Tenderness: There is no abdominal tenderness.  Musculoskeletal:        General: Normal range of motion.     Cervical back: Normal range of motion.  Lymphadenopathy:     Cervical: No cervical adenopathy.  Skin:    General: Skin is warm.     Capillary Refill: Capillary refill takes less than 2 seconds.  Neurological:     Mental Status: She is alert and  oriented to person, place, and time.    ED Results / Procedures / Treatments   Labs (all labs ordered are listed, but only abnormal results are displayed) Labs Reviewed  RESP PANEL BY RT-PCR (FLU A&B, COVID) [id number]    EKG None  Radiology No results found.  Procedures Procedures   Medications Ordered in ED Medications - No data to display  ED Course  I have reviewed the triage vital signs and the nursing notes.  Pertinent labs & imaging results that were available during my care of the patient were reviewed by me and considered in my medical decision making (see chart for details).    MDM Rules/Calculators/A&P                           Patient presenting with 2 day h/o URI symptoms.  Physical exam reassuring, patient is afebrile and appears nontoxic.  Pulmonary exam reassuring.  Doubt pneumonia, strep, other bacterial infection, or peritonsillar abscess.  Likely viral URI.  Will test for COVID and flu.  Will treat symptomatically.  Patient to follow-up with primary care as needed.  At this time, patient appears safe for discharge.  Return precautions given.  Patient states she understands and agrees to plan.  Final Clinical Impression(s) / ED Diagnoses Final diagnoses:  Viral illness    Rx / DC Orders ED Discharge Orders     None        Alveria Apley, PA-C 10/19/21 2239    Virgina Norfolk, DO 10/19/21 2252

## 2021-10-19 NOTE — ED Triage Notes (Signed)
Pt reports sore throat, dry mouth and nasal congestion since Wednesday. Denies exposure to known sick person. Denies n/v, fevers.

## 2021-12-07 ENCOUNTER — Other Ambulatory Visit: Payer: Self-pay

## 2021-12-07 ENCOUNTER — Ambulatory Visit (INDEPENDENT_AMBULATORY_CARE_PROVIDER_SITE_OTHER): Payer: Self-pay | Admitting: Nurse Practitioner

## 2021-12-07 ENCOUNTER — Encounter: Payer: Self-pay | Admitting: Nurse Practitioner

## 2021-12-07 VITALS — BP 166/99 | HR 117 | Ht 61.0 in | Wt >= 6400 oz

## 2021-12-07 DIAGNOSIS — Z0000 Encounter for general adult medical examination without abnormal findings: Secondary | ICD-10-CM

## 2021-12-07 DIAGNOSIS — R5383 Other fatigue: Secondary | ICD-10-CM

## 2021-12-07 DIAGNOSIS — I10 Essential (primary) hypertension: Secondary | ICD-10-CM

## 2021-12-07 DIAGNOSIS — R0683 Snoring: Secondary | ICD-10-CM

## 2021-12-07 LAB — POCT GLYCOSYLATED HEMOGLOBIN (HGB A1C): Hemoglobin A1C: 5.6 % (ref 4.0–5.6)

## 2021-12-07 MED ORDER — LOSARTAN POTASSIUM 50 MG PO TABS: 50.0000 mg | ORAL_TABLET | Freq: Every day | ORAL | 2 refills | Status: DC

## 2021-12-07 NOTE — Patient Instructions (Addendum)
You were seen today in the [facility] to establish care and weight loss. Labs were collected, results will be available via MyChart or, if abnormal, you will be contacted by clinic staff. You were prescribed medications, please take as directed. Please follow up in 1 wk for weight loss appointment.4 wks for pap smear.

## 2021-12-07 NOTE — Progress Notes (Signed)
[facility] Patient [facility] [address] [place], [place]  [address] Phone:  [phone]   Fax:  [phone] Subjective:   Patient ID: Dana Burnett, female    DOB: 1991-11-29, 29 y.o.   MRN: [id number]  Chief Complaint  Patient presents with   Establish Care    Pt would like to have her thyroid checked and a sleep study. Pt is interested in nutrition advice and services.   HPI Dana Burnett 31 y.o. female with no significant medical history to the [facility]  to establish care.  Patient states that she has not been to the PCP in a few years. 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 States that he father passed away in Sep 11, 2021, which motivated her to make more efforts towards being healthier.  Requesting referral to weight loss clinic. States that she has a goal to lose 200+ lbs organically. Was able to lose 7 lbs last year in 3 wks, but regained weight when her father died. States, " I definitely an emotional eater." Also requesting a sleep study, her friend informed her she has significant snoring at night, in addition to having increased daytime fatigue.   Denies following any diet regimen or exercising regularly. Requests screening for thyroid disease, states that she was told that it can contribute to increased weight. Denies any other complaints today. Denies any fever. Denies any fatigue, chest pain, shortness of breath, HA or dizziness. Denies any blurred vision, numbness or tingling.     Past Medical History:  Diagnosis Date   Allergy    Anemia    Anxiety    Depression    Sleep apnea     Past Surgical History:  Procedure Laterality Date   CHOLECYSTECTOMY N/A 07/14/2017   Procedure: LAPAROSCOPIC CHOLECYSTECTOMY WITH INTRAOPERATIVE CHOLANGIOGRAM;  Surgeon: Manus Rudd, MD;  Location: [facility] OR;  Service: General;  Laterality: N/A;   TOOTH EXTRACTION      Family History  Problem Relation Age of Onset   Diabetes Maternal Grandfather     Hypertension Maternal Grandfather    Cancer Paternal Grandmother    Diabetes Paternal Grandmother    Hypertension Paternal Grandmother     Social History   Socioeconomic History   Marital status: Single    Spouse name: Not on file   Number of children: Not on file   Years of education: Not on file   Highest education level: Not on file  Occupational History   Not on file  Tobacco Use   Smoking status: Never   Smokeless tobacco: Never  Substance and Sexual Activity   Alcohol use: No   Drug use: No   Sexual activity: Yes    Birth control/protection: Implant  Other Topics Concern   Not on file  Social History Narrative   Not on file   Social Determinants of Health   Financial Resource Strain: Not on file  Food Insecurity: Not on file  Transportation Needs: Not on file  Physical Activity: Not on file  Stress: Not on file  Social Connections: Not on file  Intimate Partner Violence: Not on file    Outpatient Medications Prior to Visit  Medication Sig Dispense Refill   azithromycin (ZITHROMAX) 250 MG tablet Take 2 tablets (500 mg) on day 1, then take 1 tablet (250 mg) on days 2-5 6 tablet 0   benzonatate (TESSALON) 100 MG capsule Take 1 capsule (100 mg total) by mouth every 8 (eight) hours as needed for cough.  21 capsule 0   No facility-administered medications prior to visit.    Allergies  Allergen Reactions   Shellfish Allergy Anaphylaxis    Review of Systems  Constitutional:  Positive for malaise/fatigue. Negative for chills and fever.  HENT: Negative.    Eyes: Negative.   Respiratory:  Negative for cough and shortness of breath.   Cardiovascular:  Negative for chest pain, palpitations and leg swelling.  Gastrointestinal:  Negative for abdominal pain, blood in stool, constipation, diarrhea, nausea and vomiting.  Genitourinary: Negative.   Musculoskeletal: Negative.   Skin: Negative.   Neurological: Negative.   Psychiatric/Behavioral:  Negative for  depression. The patient is not nervous/anxious.   All other systems reviewed and are negative.     Objective:    Physical Exam Vitals reviewed.  Constitutional:      General: She is not in acute distress.    Appearance: Normal appearance. She is obese.  HENT:     Head: Normocephalic.     Right Ear: Tympanic membrane, ear canal and external ear normal.     Left Ear: Tympanic membrane, ear canal and external ear normal.     Nose: Nose normal.     Mouth/Throat:     Mouth: Mucous membranes are moist.     Pharynx: Oropharynx is clear.  Eyes:     Extraocular Movements: Extraocular movements intact.     Conjunctiva/sclera: Conjunctivae normal.     Pupils: Pupils are equal, round, and reactive to light.  Neck:     Vascular: No carotid bruit.  Cardiovascular:     Rate and Rhythm: Normal rate and regular rhythm.     Pulses: Normal pulses.     Heart sounds: Normal heart sounds.     Comments: No obvious peripheral edema Pulmonary:     Effort: Pulmonary effort is normal.     Breath sounds: Normal breath sounds.  Abdominal:     General: Abdomen is flat. Bowel sounds are normal. There is no distension.     Palpations: Abdomen is soft. There is no mass.     Tenderness: There is no abdominal tenderness. There is no right CVA tenderness, left CVA tenderness, guarding or rebound.     Hernia: No hernia is present.  Musculoskeletal:        General: No swelling, tenderness, deformity or signs of injury. Normal range of motion.     Cervical back: Normal range of motion and neck supple. No rigidity or tenderness.     Right lower leg: No edema.     Left lower leg: No edema.  Lymphadenopathy:     Cervical: No cervical adenopathy.  Skin:    General: Skin is warm and dry.     Capillary Refill: Capillary refill takes less than 2 seconds.  Neurological:     General: No focal deficit present.     Mental Status: She is alert and oriented to person, place, and time.  Psychiatric:        Mood and  Affect: Mood normal.        Behavior: Behavior normal.        Thought Content: Thought content normal.        Judgment: Judgment normal.    BP (!) 166/99    Pulse (!) 117    Ht 5\' 1"  (1.549 m)    Wt (!) 400 lb 3.2 oz (181.5 kg)    SpO2 99%    BMI 75.62 kg/m  Wt Readings from Last 3 Encounters:  12/07/21 Marland Kitchen(!)  400 lb 3.2 oz (181.5 kg)  11/09/20 (!) 390 lb (176.9 kg)  07/12/17 (!) 321 lb 3 oz (145.7 kg)     There is no immunization history on file for this patient.  Diabetic Foot Exam - Simple   No data filed     No results found for: TSH Lab Results  Component Value Date   WBC 4.4 11/05/2020   HGB 9.4 (L) 11/05/2020   HCT 36.5 11/05/2020   MCV 63.7 (L) 11/05/2020   PLT 255 11/05/2020   Lab Results  Component Value Date   NA 136 11/05/2020   K 4.8 11/05/2020   CO2 27 11/05/2020   GLUCOSE 128 (H) 11/05/2020   BUN 9 11/05/2020   CREATININE 0.79 11/05/2020   BILITOT 0.7 11/05/2020   ALKPHOS 67 11/05/2020   AST 86 (H) 11/05/2020   ALT 54 (H) 11/05/2020   PROT 7.5 11/05/2020   ALBUMIN 3.3 (L) 11/05/2020   CALCIUM 8.3 (L) 11/05/2020   ANIONGAP 9 11/05/2020   No results found for: CHOL No results found for: HDL No results found for: LDLCALC No results found for: TRIG No results found for: CHOLHDL No results found for: HWTU8E     Assessment & Plan:   Problem List Items Addressed This Visit   None Visit Diagnoses     Healthcare maintenance    -  Primary   Relevant Orders   Comprehensive metabolic panel   CBC with Differential/Platelet   Lipid panel   TSH+T4F+T3Free Discussed at length diet and exercise options   Primary hypertension       Relevant Medications   losartan (COZAAR) 50 MG tablet Encouraged continued diet and exercise efforts  Encouraged continued compliance with medication     Morbidly obese (HCC)       Relevant Orders   POCT glycosylated hemoglobin (Hb A1C): 5.6, prediabetic   Ambulatory referral to Sleep Studies   Comprehensive  metabolic panel   CBC with Differential/Platelet   Lipid panel   Amb ref to Medical Nutrition Therapy-MNT   TSH+T4F+T3Free Recommended starting a meal diary   Snoring       Relevant Orders   Ambulatory referral to Sleep Studies   Other fatigue       Relevant Orders   Ambulatory referral to Sleep Studies   Follow up in 1 wk for weight loss and 4 wks for pap smear, sooner as needed    I have discontinued Armella A. Bomar's benzonatate and azithromycin. I am also having her start on losartan.  Meds ordered this encounter  Medications   losartan (COZAAR) 50 MG tablet    Sig: Take 1 tablet (50 mg total) by mouth daily.    Dispense:  30 tablet    Refill:  2     Kathrynn Speed, NP

## 2021-12-08 LAB — TSH+T4F+T3FREE
Free T4: 1.1 ng/dL (ref 0.82–1.77)
T3, Free: 2.8 pg/mL (ref 2.0–4.4)
TSH: 1.81 u[IU]/mL (ref 0.450–4.500)

## 2021-12-08 LAB — CBC WITH DIFFERENTIAL/PLATELET
Basophils Absolute: 0 10*3/uL (ref 0.0–0.2)
Basos: 0 %
EOS (ABSOLUTE): 0.1 10*3/uL (ref 0.0–0.4)
Eos: 1 %
Hematocrit: 32.1 % — ABNORMAL LOW (ref 34.0–46.6)
Hemoglobin: 8.9 g/dL — ABNORMAL LOW (ref 11.1–15.9)
Immature Grans (Abs): 0 10*3/uL (ref 0.0–0.1)
Immature Granulocytes: 0 %
Lymphocytes Absolute: 2.2 10*3/uL (ref 0.7–3.1)
Lymphs: 23 %
MCH: 16.5 pg — ABNORMAL LOW (ref 26.6–33.0)
MCHC: 27.7 g/dL — ABNORMAL LOW (ref 31.5–35.7)
MCV: 60 fL — ABNORMAL LOW (ref 79–97)
Monocytes Absolute: 0.8 10*3/uL (ref 0.1–0.9)
Monocytes: 8 %
Neutrophils Absolute: 6.4 10*3/uL (ref 1.4–7.0)
Neutrophils: 68 %
Platelets: 384 10*3/uL (ref 150–450)
RBC: 5.38 x10E6/uL — ABNORMAL HIGH (ref 3.77–5.28)
RDW: 20.3 % — ABNORMAL HIGH (ref 11.7–15.4)
WBC: 9.6 10*3/uL (ref 3.4–10.8)

## 2021-12-08 LAB — COMPREHENSIVE METABOLIC PANEL
ALT: 11 IU/L (ref 0–32)
AST: 20 IU/L (ref 0–40)
Albumin/Globulin Ratio: 1.3 (ref 1.2–2.2)
Albumin: 4.1 g/dL (ref 3.9–5.0)
Alkaline Phosphatase: 83 IU/L (ref 44–121)
BUN/Creatinine Ratio: 11 (ref 9–23)
BUN: 9 mg/dL (ref 6–20)
Bilirubin Total: 0.3 mg/dL (ref 0.0–1.2)
CO2: 28 mmol/L (ref 20–29)
Calcium: 9.2 mg/dL (ref 8.7–10.2)
Chloride: 98 mmol/L (ref 96–106)
Creatinine, Ser: 0.81 mg/dL (ref 0.57–1.00)
Globulin, Total: 3.2 g/dL (ref 1.5–4.5)
Glucose: 93 mg/dL (ref 70–99)
Potassium: 4.8 mmol/L (ref 3.5–5.2)
Sodium: 137 mmol/L (ref 134–144)
Total Protein: 7.3 g/dL (ref 6.0–8.5)
eGFR: 101 mL/min/{1.73_m2} (ref >59)

## 2021-12-08 LAB — LIPID PANEL
Chol/HDL Ratio: 2.7 ratio (ref 0.0–4.4)
Cholesterol, Total: 154 mg/dL (ref 100–199)
HDL: 57 mg/dL (ref >39)
LDL Chol Calc (NIH): 86 mg/dL (ref 0–99)
Triglycerides: 53 mg/dL (ref 0–149)
VLDL Cholesterol Cal: 11 mg/dL (ref 5–40)

## 2021-12-14 ENCOUNTER — Ambulatory Visit: Payer: Self-pay | Admitting: Nurse Practitioner

## 2021-12-17 ENCOUNTER — Other Ambulatory Visit: Payer: Self-pay

## 2021-12-17 ENCOUNTER — Encounter: Payer: Self-pay | Admitting: Nurse Practitioner

## 2021-12-17 ENCOUNTER — Ambulatory Visit (INDEPENDENT_AMBULATORY_CARE_PROVIDER_SITE_OTHER): Payer: Self-pay | Admitting: Nurse Practitioner

## 2021-12-17 VITALS — BP 141/87 | HR 102 | Temp 98.0°F | Ht 61.0 in | Wt 396.6 lb

## 2021-12-17 DIAGNOSIS — I10 Essential (primary) hypertension: Secondary | ICD-10-CM

## 2021-12-17 DIAGNOSIS — R000 Tachycardia, unspecified: Secondary | ICD-10-CM

## 2021-12-17 MED ORDER — METOPROLOL SUCCINATE ER 25 MG PO TB24: 25.0000 mg | ORAL_TABLET | Freq: Every day | ORAL | 3 refills | Status: DC

## 2021-12-17 NOTE — Progress Notes (Signed)
[place], [place]  [address] Phone:  [phone]   Fax:  [phone] Subjective:   Patient ID: Dana Burnett, female    DOB: 1991-01-22, 31 y.o.   MRN: [id number]  Chief Complaint  Patient presents with   Follow-up    Pt is here today for her weight loss follow up visit. Pt states that she has the nexplanon birth control and back it was inserted in her left arm back in 2013 and it needs to be removed as soon as possible.   HPI Dana Burnett 31 y.o. female  has a past medical history of Allergy, Anemia, Anxiety, Depression, and Sleep apnea. To the [facility] for weight loss management.   Patient verbalized weight loss goal of 100+ lbs at previous visit, was referred to weight management, but wants to begin intervention towards weight loss prior to follow up with specialist. Patient has not completed food/ meal diary, but has been monitoring meals over the past week. States that she continues to have difficulty with carbohydrate craving, such as fries and pasta. States that she has reduced amount of meals per day to twice per day and increased water intake to 60 oz per day. Denies any increase in exercise/ activity efforts over the past week . Verbalizes continued struggles with dessert/ candy cravings. Had a couple of days in the past week when she indulged in cake and candy. States that she was stuck in traffic one day last week and stopped for fast food, due to convenience.   Patient also concerned about Nexplanon, requesting removal. Patient states that she had Nexplanon placed in 2013, in separate outpatient office, for management of menstrual cycles. Has had increased anxiety about removal of Nexplanon, which caused her to defer removal. Has been compliant with all medications. Denies any other complaints today.  Denies any fatigue, chest pain, shortness of breath, HA or dizziness. Denies any blurred vision, numbness or tingling.  Past  Medical History:  Diagnosis Date   Allergy    Anemia    Anxiety    Depression    Sleep apnea     Past Surgical History:  Procedure Laterality Date   CHOLECYSTECTOMY N/A 07/14/2017   Procedure: LAPAROSCOPIC CHOLECYSTECTOMY WITH INTRAOPERATIVE CHOLANGIOGRAM;  Surgeon: Donnie Mesa, MD;  Location: [facility] OR;  Service: General;  Laterality: N/A;   TOOTH EXTRACTION      Family History  Problem Relation Age of Onset   Diabetes Maternal Grandfather    Hypertension Maternal Grandfather    Cancer Paternal Grandmother    Diabetes Paternal Grandmother    Hypertension Paternal Grandmother     Social History   Socioeconomic History   Marital status: Single    Spouse name: Not on file   Number of children: Not on file   Years of education: Not on file   Highest education level: Not on file  Occupational History   Not on file  Tobacco Use   Smoking status: Never   Smokeless tobacco: Never  Vaping Use   Vaping Use: Never used  Substance and Sexual Activity   Alcohol use: Yes    Comment: occ   Drug use: No   Sexual activity: Yes    Birth control/protection: Implant  Other Topics Concern   Not on file  Social History Narrative   Not on file   Social Determinants of Health   Financial Resource Strain: Not on file  Food Insecurity: Not on file  Transportation  Needs: Not on file  Physical Activity: Not on file  Stress: Not on file  Social Connections: Not on file  Intimate Partner Violence: Not on file    Outpatient Medications Prior to Visit  Medication Sig Dispense Refill   losartan (COZAAR) 50 MG tablet Take 1 tablet (50 mg total) by mouth daily. 30 tablet 2   No facility-administered medications prior to visit.    Allergies  Allergen Reactions   Shellfish Allergy Anaphylaxis    Review of Systems  Constitutional:  Negative for chills, fever and malaise/fatigue.  Respiratory:  Negative for cough and shortness of breath.   Cardiovascular:  Negative for chest  pain, palpitations and leg swelling.  Gastrointestinal:  Negative for abdominal pain, blood in stool, constipation, diarrhea, nausea and vomiting.  Skin: Negative.   Neurological: Negative.   Psychiatric/Behavioral:  Negative for depression. The patient is not nervous/anxious.   All other systems reviewed and are negative.     Objective:    Physical Exam Vitals reviewed.  Constitutional:      General: She is not in acute distress.    Appearance: Normal appearance. She is obese.  HENT:     Head: Normocephalic.  Cardiovascular:     Rate and Rhythm: Normal rate and regular rhythm.     Pulses: Normal pulses.     Heart sounds: Normal heart sounds.     Comments: No obvious peripheral edema Pulmonary:     Effort: Pulmonary effort is normal.     Breath sounds: Normal breath sounds.  Skin:    General: Skin is warm and dry.     Capillary Refill: Capillary refill takes less than 2 seconds.  Neurological:     General: No focal deficit present.     Mental Status: She is alert and oriented to person, place, and time.  Psychiatric:        Mood and Affect: Mood normal.        Behavior: Behavior normal.        Thought Content: Thought content normal.        Judgment: Judgment normal.    BP (!) 141/87    Pulse (!) 102    Temp 98 F (36.7 C)    Ht $R'5\' 1"'Lv$  (1.549 m)    Wt (!) 396 lb 9.6 oz (179.9 kg)    SpO2 98%    BMI 74.94 kg/m  Wt Readings from Last 3 Encounters:  12/17/21 (!) 396 lb 9.6 oz (179.9 kg)  12/07/21 (!) 400 lb 3.2 oz (181.5 kg)  11/09/20 (!) 390 lb (176.9 kg)     There is no immunization history on file for this patient.  Diabetic Foot Exam - Simple   No data filed     Lab Results  Component Value Date   TSH 1.810 12/07/2021   Lab Results  Component Value Date   WBC 9.6 12/07/2021   HGB 8.9 (L) 12/07/2021   HCT 32.1 (L) 12/07/2021   MCV 60 (L) 12/07/2021   PLT 384 12/07/2021   Lab Results  Component Value Date   NA 137 12/07/2021   K 4.8 12/07/2021    CO2 28 12/07/2021   GLUCOSE 93 12/07/2021   BUN 9 12/07/2021   CREATININE 0.81 12/07/2021   BILITOT 0.3 12/07/2021   ALKPHOS 83 12/07/2021   AST 20 12/07/2021   ALT 11 12/07/2021   PROT 7.3 12/07/2021   ALBUMIN 4.1 12/07/2021   CALCIUM 9.2 12/07/2021   ANIONGAP 9 11/05/2020   EGFR 101  12/07/2021   Lab Results  Component Value Date   CHOL 154 12/07/2021   Lab Results  Component Value Date   HDL 57 12/07/2021   Lab Results  Component Value Date   LDLCALC 86 12/07/2021   Lab Results  Component Value Date   TRIG 53 12/07/2021   Lab Results  Component Value Date   CHOLHDL 2.7 12/07/2021   Lab Results  Component Value Date   HGBA1C 5.6 12/07/2021       Assessment & Plan:   Problem List Items Addressed This Visit   None Visit Diagnoses     Primary hypertension    -  Primary   Relevant Medications   metoprolol succinate (TOPROL-XL) 25 MG 24 hr tablet, add to therapy for hypertension due consistent tachycardia  Encouraged continued diet and exercise efforts  Encouraged continued compliance with medication     Tachycardia with hypertension       Relevant Medications   metoprolol succinate (TOPROL-XL) 25 MG 24 hr tablet   Morbidly obese ([facility])     Discussed completed for food/ diet diary  Discussed methods for reducing cravings Discussed portion sizes and meals per day  Discussed water intake Will review further at follow up in 3 wks   Follow up in 3 wks for Pap smear, Nexplanon removal and weight management, sooner as needed    I am having Vincent A. Storr start on metoprolol succinate. I am also having her maintain her losartan.  Meds ordered this encounter  Medications   metoprolol succinate (TOPROL-XL) 25 MG 24 hr tablet    Sig: Take 1 tablet (25 mg total) by mouth daily.    Dispense:  90 tablet    Refill:  3     Teena Dunk, NP

## 2021-12-17 NOTE — Patient Instructions (Signed)
You were seen today in the [facility] for weight management. Labs were collected, results will be available via MyChart or, if abnormal, you will be contacted by clinic staff. You were prescribed medications, please take as directed. Please follow up in February for pap smear.

## 2022-01-04 ENCOUNTER — Other Ambulatory Visit: Payer: Self-pay

## 2022-01-04 ENCOUNTER — Encounter: Payer: Self-pay | Admitting: Nurse Practitioner

## 2022-01-04 ENCOUNTER — Ambulatory Visit (INDEPENDENT_AMBULATORY_CARE_PROVIDER_SITE_OTHER): Payer: Self-pay | Admitting: Nurse Practitioner

## 2022-01-04 VITALS — BP 139/90 | HR 99 | Temp 98.0°F | Ht 61.0 in | Wt 391.2 lb

## 2022-01-04 DIAGNOSIS — Z3046 Encounter for surveillance of implantable subdermal contraceptive: Secondary | ICD-10-CM

## 2022-01-04 DIAGNOSIS — Z01419 Encounter for gynecological examination (general) (routine) without abnormal findings: Secondary | ICD-10-CM

## 2022-01-04 NOTE — Progress Notes (Signed)
[facility] Patient [facility] [address] [place], [place]  [address] Phone:  [phone]   Fax:  [phone] Subjective:   Patient ID: Dana Burnett, female    DOB: [birth year], 31 y.o.   MRN: [id number]  Chief Complaint  Patient presents with   Follow-up    Pt is here for pap smear and nexplanon removal    Dana Burnett 31 y.o. female  has a past medical history of Allergy, Anemia, Anxiety, Depression, and Sleep apnea. To the [facility] for women's wellness exam and Nexplanon removal.   Gynecologic Exam The patient's pertinent negatives include no genital itching, genital lesions, genital odor, genital rash, missed menses, pelvic pain, vaginal bleeding or vaginal discharge. The patient is experiencing no pain. She is not pregnant. Pertinent negatives include no abdominal pain, back pain, chills, dysuria, fever, headaches, hematuria, joint swelling, nausea, urgency or vomiting. No, her partner does not have an STD. She uses nothing for contraception. Her menstrual history has been regular. There is no history of a gynecological surgery, herpes simplex, menorrhagia, metrorrhagia, miscarriage, ovarian cysts, PID, an STD, a terminated pregnancy or vaginosis.   Patient denies any sexual activity in the past 6 mths. Denies any breast concerns. Has history of paternal breast cancer, but denies completing SBE at home.   Concerned about Nexplanon removal, since it has been implanted 6 yrs past recommended removal date. Denies any other complaints today.   Denies any fever. Denies any fatigue, chest pain, shortness of breath, HA or dizziness. Denies any blurred vision, numbness or tingling.   Past Medical History:  Diagnosis Date   Allergy    Anemia    Anxiety    Depression    Sleep apnea     Past Surgical History:  Procedure Laterality Date   CHOLECYSTECTOMY N/A 07/14/2017   Procedure: LAPAROSCOPIC CHOLECYSTECTOMY WITH INTRAOPERATIVE CHOLANGIOGRAM;  Surgeon: Manus Rudd, MD;   Location: [facility] OR;  Service: General;  Laterality: N/A;   TOOTH EXTRACTION      Family History  Problem Relation Age of Onset   Diabetes Maternal Grandfather    Hypertension Maternal Grandfather    Cancer Paternal Grandmother    Diabetes Paternal Grandmother    Hypertension Paternal Grandmother     Social History   Socioeconomic History   Marital status: Single    Spouse name: Not on file   Number of children: Not on file   Years of education: Not on file   Highest education level: Not on file  Occupational History   Not on file  Tobacco Use   Smoking status: Never   Smokeless tobacco: Never  Vaping Use   Vaping Use: Never used  Substance and Sexual Activity   Alcohol use: Yes    Comment: occ   Drug use: No   Sexual activity: Yes    Birth control/protection: Implant  Other Topics Concern   Not on file  Social History Narrative   Not on file   Social Determinants of Health   Financial Resource Strain: Not on file  Food Insecurity: Not on file  Transportation Needs: Not on file  Physical Activity: Not on file  Stress: Not on file  Social Connections: Not on file  Intimate Partner Violence: Not on file    Outpatient Medications Prior to Visit  Medication Sig Dispense Refill   losartan (COZAAR) 50 MG tablet Take 1 tablet (50 mg total) by mouth daily. 30 tablet 2   metoprolol succinate (TOPROL-XL) 25 MG 24 hr  tablet Take 1 tablet (25 mg total) by mouth daily. 90 tablet 3   No facility-administered medications prior to visit.    Allergies  Allergen Reactions   Shellfish Allergy Anaphylaxis    Review of Systems  Constitutional:  Negative for chills, fever and malaise/fatigue.  Respiratory:  Negative for cough and shortness of breath.   Cardiovascular:  Negative for chest pain, palpitations and leg swelling.  Gastrointestinal:  Negative for abdominal pain, blood in stool, constipation, diarrhea, nausea and vomiting.  Genitourinary:  Negative for dysuria,  hematuria and urgency.  Musculoskeletal:  Negative for back pain.  Skin: Negative.   Neurological:  Negative for headaches.  Psychiatric/Behavioral:  Negative for depression. The patient is not nervous/anxious.   All other systems reviewed and are negative.     Objective:    Physical Exam Vitals reviewed. Exam conducted with a chaperone present.  Constitutional:      General: She is not in acute distress.    Appearance: Normal appearance. She is obese.  HENT:     Head: Normocephalic.  Cardiovascular:     Rate and Rhythm: Normal rate and regular rhythm.     Pulses: Normal pulses.     Heart sounds: Normal heart sounds.     Comments: No obvious peripheral edema Pulmonary:     Effort: Pulmonary effort is normal.     Breath sounds: Normal breath sounds.  Chest:     Chest wall: No mass, lacerations or deformity.  Breasts:    Breasts are symmetrical.     Right: Normal. No swelling, bleeding, inverted nipple, mass, nipple discharge, skin change or tenderness.     Left: Normal. No swelling, bleeding, inverted nipple, mass, nipple discharge, skin change or tenderness.  Genitourinary:    Labia:        Right: No rash, tenderness, lesion or injury.        Left: No rash, tenderness, lesion or injury.      Vagina: Normal.     Cervix: No cervical motion tenderness, discharge, friability, lesion, erythema, cervical bleeding or eversion.     Uterus: Normal.   Lymphadenopathy:     Upper Body:     Right upper body: No axillary adenopathy.     Left upper body: No axillary adenopathy.  Skin:    General: Skin is warm and dry.     Capillary Refill: Capillary refill takes less than 2 seconds.  Neurological:     General: No focal deficit present.     Mental Status: She is alert and oriented to person, place, and time.  Psychiatric:        Mood and Affect: Mood normal.        Behavior: Behavior normal.        Thought Content: Thought content normal.        Judgment: Judgment normal.    BP  139/90    Pulse 99    Temp 98 F (36.7 C)    Ht $R'5\' 1"'De$  (1.549 m)    Wt (!) 391 lb 4 oz (177.5 kg)    SpO2 100%    BMI 73.93 kg/m  Wt Readings from Last 3 Encounters:  01/04/22 (!) 391 lb 4 oz (177.5 kg)  12/17/21 (!) 396 lb 9.6 oz (179.9 kg)  12/07/21 (!) 400 lb 3.2 oz (181.5 kg)     There is no immunization history on file for this patient.  Diabetic Foot Exam - Simple   No data filed     Lab Results  Component Value Date   TSH 1.810 12/07/2021   Lab Results  Component Value Date   WBC 9.6 12/07/2021   HGB 8.9 (L) 12/07/2021   HCT 32.1 (L) 12/07/2021   MCV 60 (L) 12/07/2021   PLT 384 12/07/2021   Lab Results  Component Value Date   NA 137 12/07/2021   K 4.8 12/07/2021   CO2 28 12/07/2021   GLUCOSE 93 12/07/2021   BUN 9 12/07/2021   CREATININE 0.81 12/07/2021   BILITOT 0.3 12/07/2021   ALKPHOS 83 12/07/2021   AST 20 12/07/2021   ALT 11 12/07/2021   PROT 7.3 12/07/2021   ALBUMIN 4.1 12/07/2021   CALCIUM 9.2 12/07/2021   ANIONGAP 9 11/05/2020   EGFR 101 12/07/2021   Lab Results  Component Value Date   CHOL 154 12/07/2021   Lab Results  Component Value Date   HDL 57 12/07/2021   Lab Results  Component Value Date   LDLCALC 86 12/07/2021   Lab Results  Component Value Date   TRIG 53 12/07/2021   Lab Results  Component Value Date   CHOLHDL 2.7 12/07/2021   Lab Results  Component Value Date   HGBA1C 5.6 12/07/2021       Assessment & Plan:  Nexplanon Removal:  Completed with the assistance of NP King Location of nexplanon completed with deep palpation and marked with surgical marker  1% lidocaine injection at the site, 0.5 cc Stab incision made with #11 blade  Nexplanon successfully removed with hemostats  Dry dressing applied  Procedure completed without complication Patient given anticipatory guidance   Problem List Items Addressed This Visit   None Visit Diagnoses     Women's annual routine gynecological examination    -  Primary    Relevant Orders   Pap IG and Chlamydia/Gonococcus, NAA ([facility])   NuSwab Vaginitis Plus (VG+) Discussed safe sex practices    Nexplanon removal     Given anticipatory guidance following removal   Follow up in 1 mth for weight management, sooner as needed     I am having Hassie A. Krahl maintain her losartan and metoprolol succinate.  No orders of the defined types were placed in this encounter.    Teena Dunk, NP

## 2022-01-04 NOTE — Patient Instructions (Signed)
You were seen today in the [facility] for women's wellness exam and nexplanon removal. Labs were collected, results will be available via MyChart or, if abnormal, you will be contacted by clinic staff. You were prescribed medications, please take as directed. Please follow up in 1 mth for reevaluation of hypertension and weight management.

## 2022-01-07 ENCOUNTER — Other Ambulatory Visit: Payer: Self-pay | Admitting: Nurse Practitioner

## 2022-01-07 DIAGNOSIS — B9689 Other specified bacterial agents as the cause of diseases classified elsewhere: Secondary | ICD-10-CM

## 2022-01-07 DIAGNOSIS — N760 Acute vaginitis: Secondary | ICD-10-CM

## 2022-01-07 LAB — NUSWAB VAGINITIS PLUS (VG+)
BVAB 2: HIGH Score — AB
Candida albicans, NAA: NEGATIVE
Candida glabrata, NAA: NEGATIVE
Chlamydia trachomatis, NAA: NEGATIVE
Neisseria gonorrhoeae, NAA: NEGATIVE
Trich vag by NAA: NEGATIVE

## 2022-01-07 MED ORDER — METRONIDAZOLE 500 MG PO TABS: 500.0000 mg | ORAL_TABLET | Freq: Two times a day (BID) | ORAL | 0 refills | Status: AC

## 2022-01-09 LAB — PAP IG AND CT-NG NAA
Chlamydia, Nuc. Acid Amp: NEGATIVE
Gonococcus by Nucleic Acid Amp: NEGATIVE

## 2022-01-09 IMAGING — CR DG CHEST 2V
2 series · 2 of 2 positions shown · non-contrast
Comparison: None.

CLINICAL DATA: Cough, OKB69-T3 positive

EXAM:
CHEST - 2 VIEW

[w chest pa]
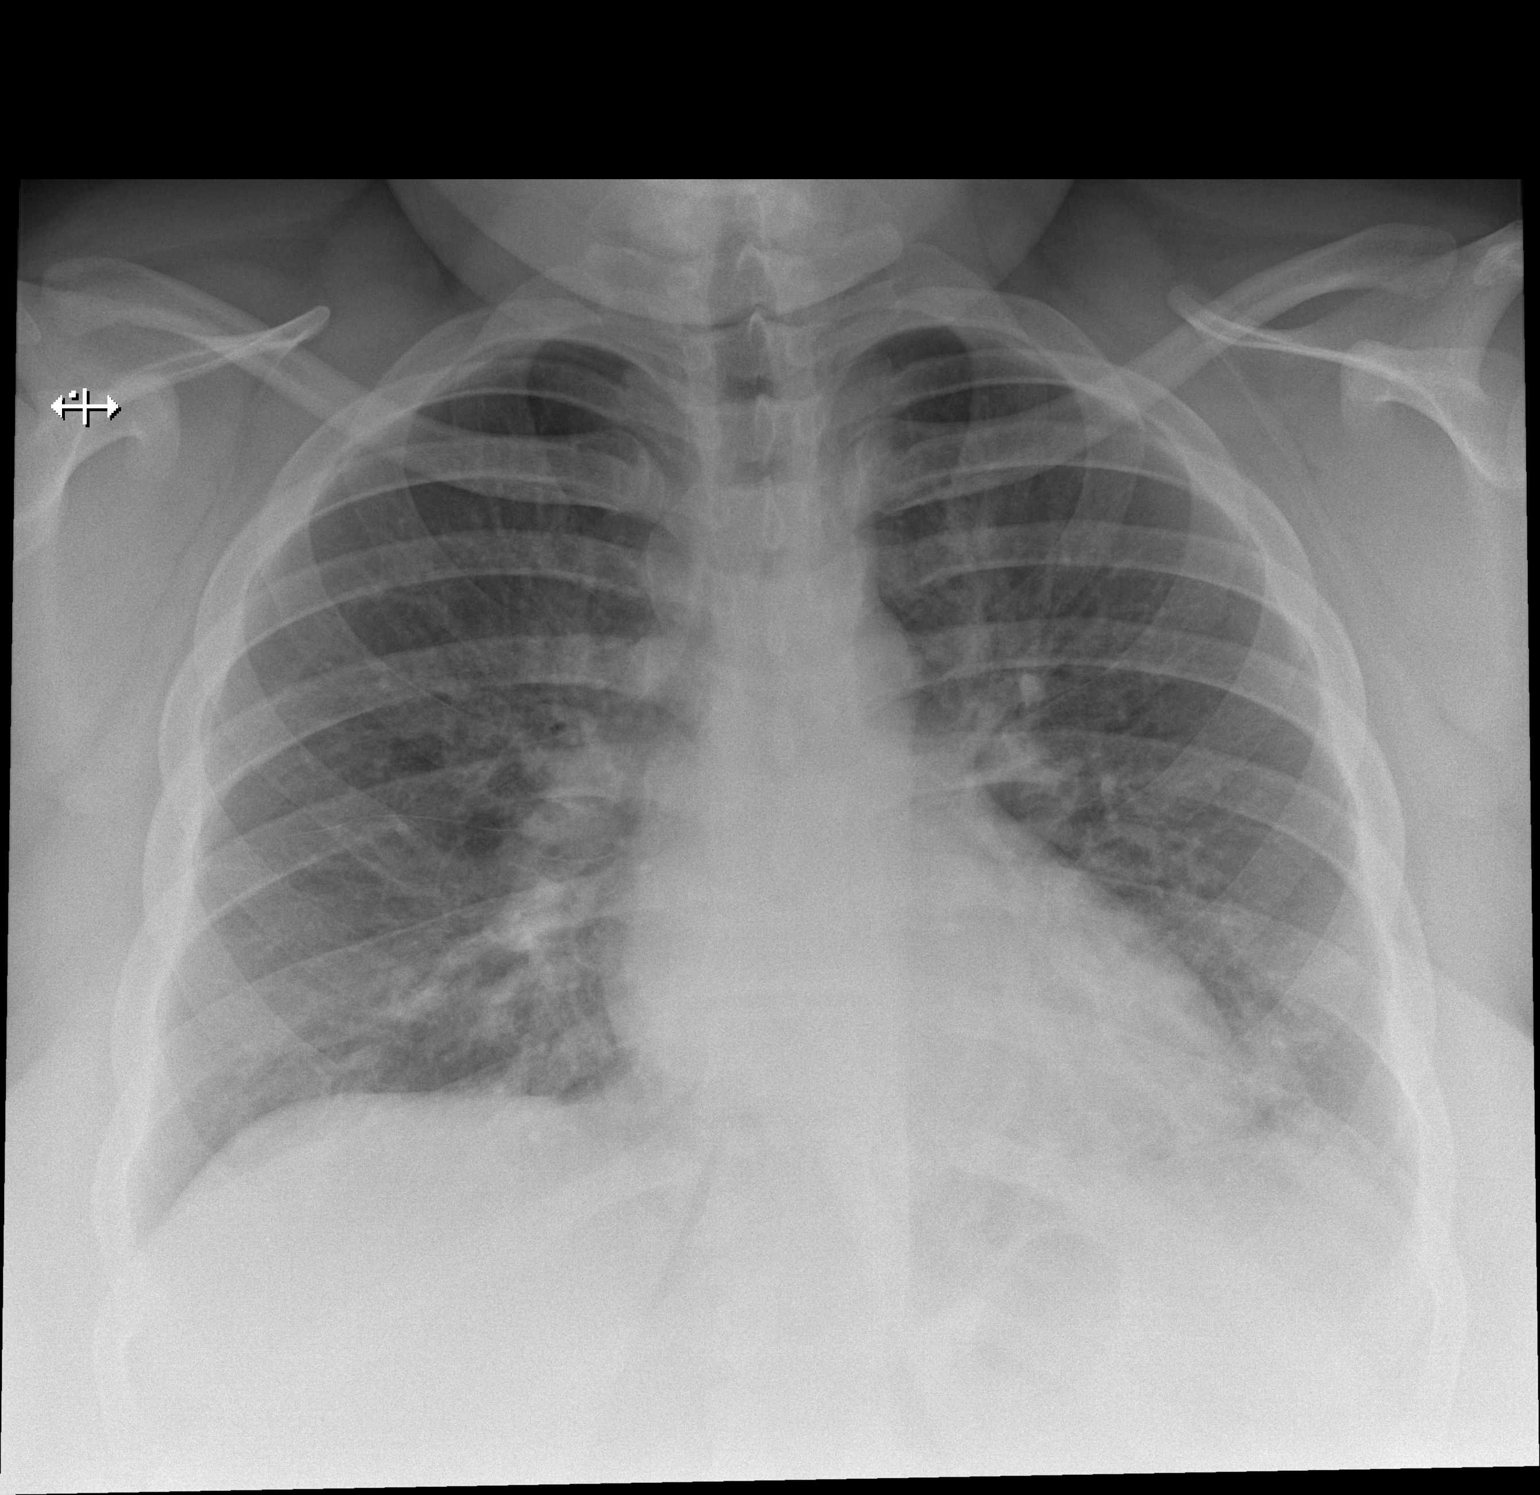

[w chest lat]
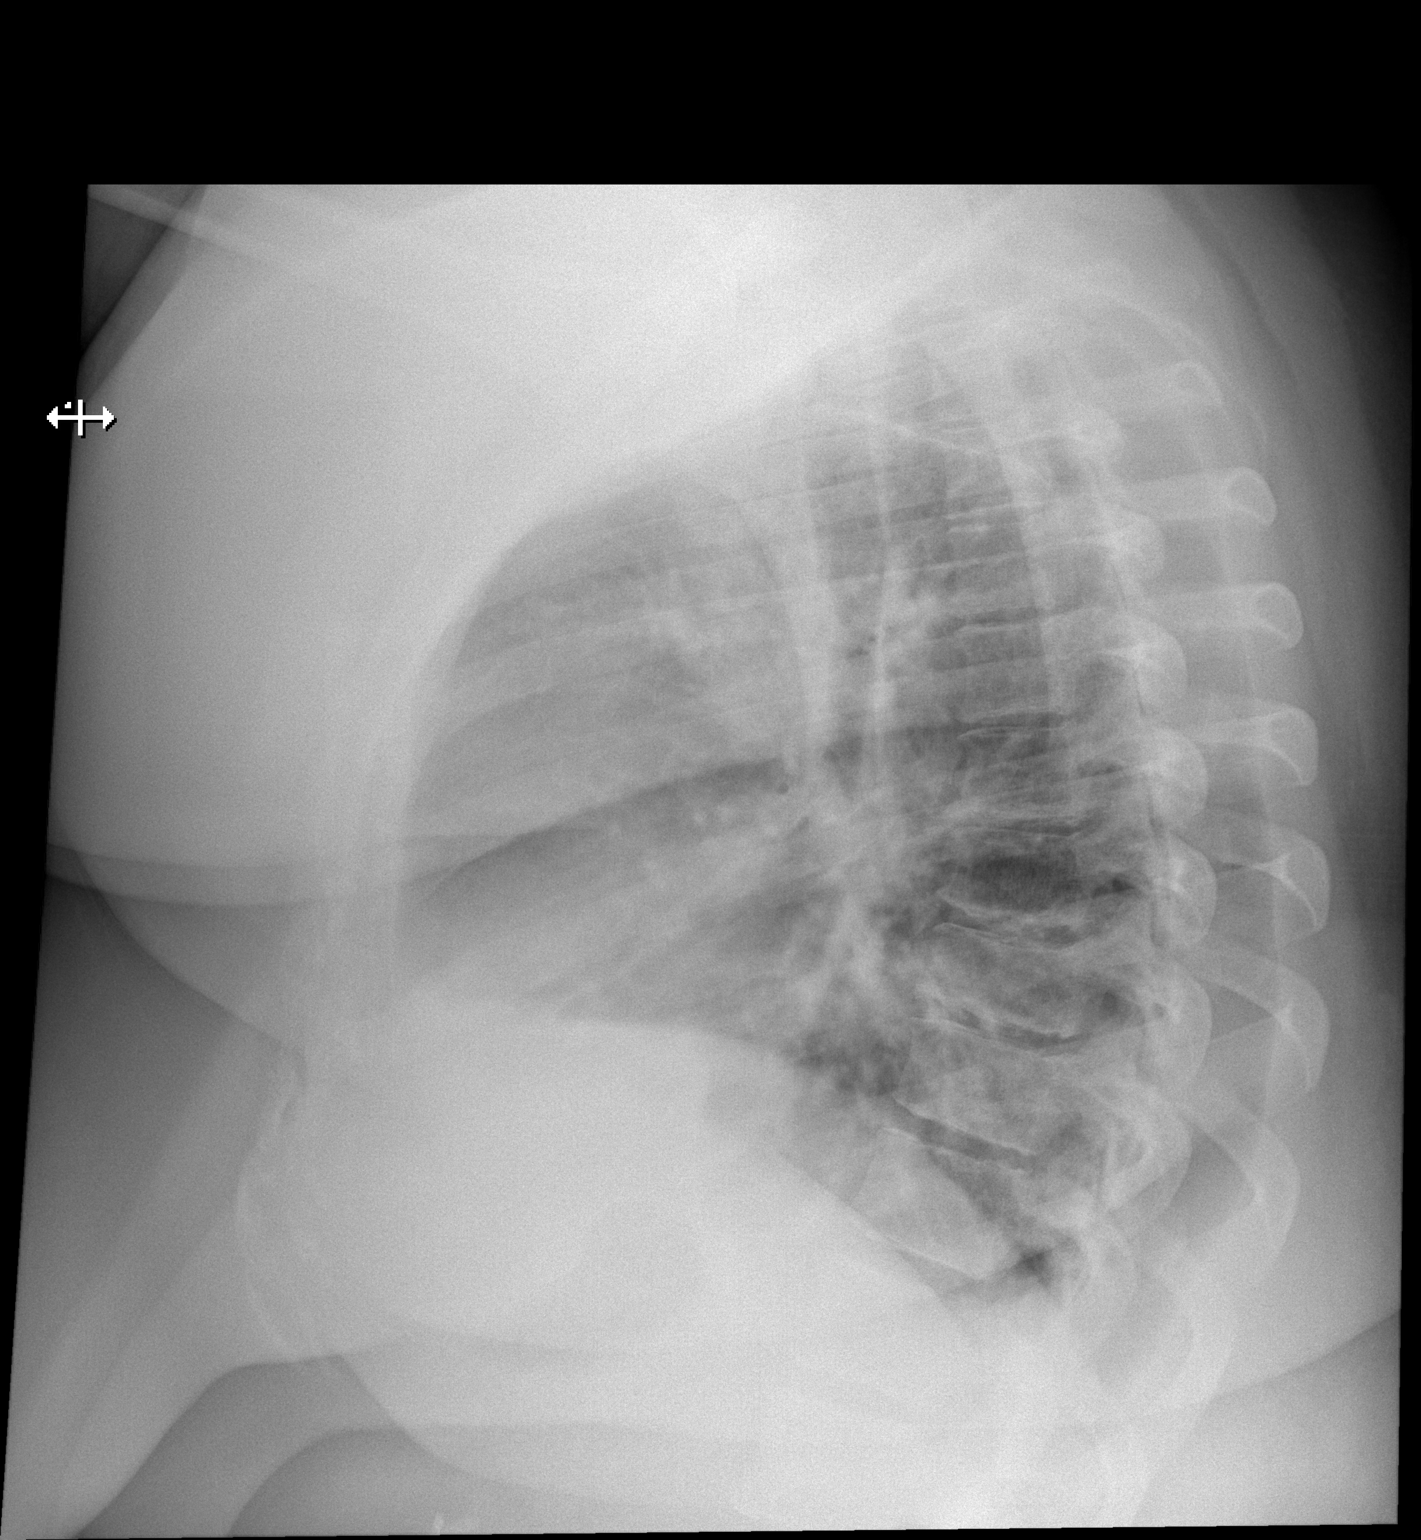

[2 of 2 positions shown; findings below may reference images not displayed]

FINDINGS: Consolidative opacity is present in the left lung base partially
silhouetting the left hemidiaphragm. Additional hazy and patchy
opacities present the right perihilar space as well. Diffuse airways
thickening. No pneumothorax. No effusion. The cardiomediastinal
contours are unremarkable. No acute osseous or soft tissue
abnormality.
IMPRESSION: Findings compatible with multifocal pneumonia in the setting of
OKB69-T3.

## 2022-01-11 ENCOUNTER — Telehealth: Payer: Self-pay | Admitting: Nurse Practitioner

## 2022-01-11 NOTE — Telephone Encounter (Signed)
Patient is requesting lab results from last visit. Left VM on 2/7

## 2022-01-29 ENCOUNTER — Ambulatory Visit: Payer: Medicaid Other | Admitting: Registered"

## 2022-02-01 ENCOUNTER — Ambulatory Visit: Payer: Medicaid Other | Admitting: Nurse Practitioner

## 2022-02-04 ENCOUNTER — Ambulatory Visit: Payer: Medicaid Other | Admitting: Nurse Practitioner

## 2022-03-20 ENCOUNTER — Encounter: Payer: Medicaid Other | Attending: Nurse Practitioner | Admitting: Registered"

## 2022-03-20 ENCOUNTER — Encounter: Payer: Self-pay | Admitting: Registered"

## 2022-03-20 DIAGNOSIS — Z713 Dietary counseling and surveillance: Secondary | ICD-10-CM | POA: Insufficient documentation

## 2022-03-20 DIAGNOSIS — E669 Obesity, unspecified: Secondary | ICD-10-CM

## 2022-03-20 NOTE — Progress Notes (Signed)
Medical Nutrition Therapy  ?Appointment Start time:  9:31  Appointment End time:  10:45 ? ?Primary concerns today: wants a nutrition plan for her blood type or body type  ?Referral diagnosis: obesity ?Preferred learning style: no preference indicated ?Learning readiness: change in progress ? ? ?NUTRITION ASSESSMENT  ? ?Pt states she is trying to lose weight. States her goal is to be less than 200 lbs. States typically she will start a routine and doesn't see any progress. States she doesn't know if her body can naturally lose the weight or if she would need weight loss surgery. States she wanted to lose weight initially for appearance reasons but now its really due to fatigue and ability to do things. States she wants to go out and do things with family and partner and unable to physically do them due to fatigue.  ?States it hinders her from work opportunities due to fatigue. States she gets discouraged when going to the gym due to fear of embarrassment and people staring at her. States she attended water aerobics once but talked herself out of going again although it was a positive experience.  ? ?Denies constipation/diarrhea, acid reflux. Reports having 1 bowel movement daily. States sometimes she wakes up with headaches. States she is unsure if cause is due to sleep challenges; has sleep study referral and needs to call and make appointment.  ? ?States she lives with partner and currently works in [employer], Mon-Fri 10 pm-6 am. 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 States she wants to get caught up financially due to 2 months of not working which was also a high stress time (01/2022-03/2022). States she was experiencing some depression during that time and stopped taking medications.  ? ?States she expereinced 10 lb weight loss in 12/2021 when eating 6 small meals/day and portioning meals. Reports she realized eating more  frequently was healthy for her. States she is pleased to know that her labs are within normal limits.  ? ? ?Clinical ?Medical Hx: none ?Medications: See list ?Labs: all within normal limits ?Notable Signs/Symptoms: fatigue ? ?Lifestyle & Dietary Hx ? ?Estimated daily fluid intake: 100 oz ?Supplements: See list ?Sleep: 7-8 hrs/night ?Stress / self-care: sleep, listen to music ?Current average weekly physical activity: none reported ? ?24-Hr Dietary Recall: (worked 7 am - 3 pm 2 days ago and 10 pm - 6 am yesterday) ?First Meal:  ?Snack (9-10 am): sometimes 3-4 mini slim jims ?Second Meal (11-12 pm): popcorn or 7 chicken wings + 4 frozen mini egg rolls + sweet tea ?Snack:  ?Third Meal (5-6 pm): 2 fish stick sandwiches or Lindie Spruce - 1/2 sub sandwich + chips + soda ?Snack (1 am): 2 Reese's cups + water ?Snack (3 am): Red Bull ?Beverages: water 3-5*16 oz; 48-80 oz), soda (20 oz), sweet tea (20 oz), Red Bull (12 oz); 100 oz ? ? ?NUTRITION DIAGNOSIS  ?NB-1.1 Food and nutrition-related knowledge deficit As related to having balanced meals.  As evidenced by dietary recall. ? ? ?NUTRITION INTERVENTION  ?Nutrition education (E-1) on the following topics: Pt was educated and counseled on the benefits of eating throughout the day, metabolism, eating to fuel the body, how skipping meals affects weight, and breakfast ideas. Pt was educated on the benefits of eating a variety of food groups at each meal. Discussed the purpose of each food group and ways to create balance with already established regimen. Discussed eating  every 3-5 hours to help adequately nourish body, weight stigma, and diet culture. Discussed importance of physical activity. Pt agreed with goals listed.  ? ?Handouts Provided Include  ?Shop Simple with MyPlate ? ?Learning Style & Readiness for Change ?Teaching method utilized: Visual & Auditory  ?Demonstrated degree of understanding via: Teach Back  ?Barriers to learning/adherence to lifestyle change: contemplative  stage of change ? ?Goals Established by Pt ?Aim to be consistent in eating 3 meals a day + snacks.  ?Balance meals with lean protein + vegetables + fruit + grain/starch + non-fat/low-fat dairy. See handout for guide.  ?Balance with source of carbohydrates + protein such as: ?Peanut butter crackers ?Cheese and crackers ?Fruit and peanut butter ?Fruit and nuts ? ? ?MONITORING & EVALUATION ?Dietary intake, weekly physical activity. ? ?Next Steps  ?Patient is to follow-up in 2 months. ?

## 2022-03-20 NOTE — Patient Instructions (Signed)
-   Aim to be consistent in eating 3 meals a day + snacks.  ? ?- Balance meals with lean protein + vegetables + fruit + grain/starch + non-fat/low-fat dairy. See handout for guide.  ? ?- Balance with source of carbohydrates + protein such as: ?Peanut butter crackers ?Cheese and crackers ?Fruit and peanut butter ?Fruit and nuts ? ? ?  ?

## 2022-05-08 ENCOUNTER — Ambulatory Visit: Payer: Medicaid Other | Admitting: Registered"

## 2023-02-11 ENCOUNTER — Encounter: Payer: Self-pay | Admitting: Nurse Practitioner

## 2023-02-11 ENCOUNTER — Ambulatory Visit (INDEPENDENT_AMBULATORY_CARE_PROVIDER_SITE_OTHER): Payer: Medicaid Other | Admitting: Nurse Practitioner

## 2023-02-11 VITALS — BP 133/73 | HR 98 | Temp 97.2°F | Ht 61.0 in | Wt 379.4 lb

## 2023-02-11 DIAGNOSIS — Z6841 Body Mass Index (BMI) 40.0 and over, adult: Secondary | ICD-10-CM | POA: Diagnosis not present

## 2023-02-11 DIAGNOSIS — R000 Tachycardia, unspecified: Secondary | ICD-10-CM | POA: Diagnosis not present

## 2023-02-11 DIAGNOSIS — I10 Essential (primary) hypertension: Secondary | ICD-10-CM

## 2023-02-11 MED ORDER — LOSARTAN POTASSIUM 50 MG PO TABS: 50.0000 mg | ORAL_TABLET | Freq: Every day | ORAL | 0 refills | Status: DC

## 2023-02-11 MED ORDER — METOPROLOL SUCCINATE ER 25 MG PO TB24: 25.0000 mg | ORAL_TABLET | Freq: Every day | ORAL | 0 refills | Status: DC

## 2023-02-11 NOTE — Patient Instructions (Addendum)
Please get your TDAP vaccine at the pharmacy , consider getting your flu vaccine as well.    It is important that you exercise regularly at least 30 minutes 5 times a week as tolerated  Think about what you will eat, plan ahead. Choose " clean, green, fresh or frozen" over canned, processed or packaged foods which are more sugary, salty and fatty. 70 to 75% of food eaten should be vegetables and fruit. Three meals at set times with snacks allowed between meals, but they must be fruit or vegetables. Aim to eat over a 12 hour period , example 7 am to 7 pm, and STOP after  your last meal of the day. Drink water,generally about 64 ounces per day, no other drink is as healthy. Fruit juice is best enjoyed in a healthy way, by EATING the fruit.  Thanks for choosing [facility] we consider it a privelige to serve you.

## 2023-02-11 NOTE — Progress Notes (Signed)
New Patient Office Visit  Subjective    Patient ID: Dana Burnett, female    DOB: 07-20-1991  Age: 32 y.o. MRN: [id number]  CC:  Chief Complaint  Patient presents with   Weight Loss   Hypertension    HPI Dana Burnett  has a past medical history of Allergy, Anemia, Anxiety, Depression, Hypertension, and Sleep apnea. presents to establish care for her chronic medical conditions.  She was last seen in this office about a year ago by [facility] NP   Hypertension.  She was on losartan 50 mg daily, metoprolol 25 mg daily stated that she has been out of the medications for a while.  She denies dizziness, chest pain, edema.  Obesity.  Patient started that since about a week ago she had started making changes to her diet also doing some walking exercises at home.   Due for flu vaccine flu vaccine declined.  She was encouraged to get her Tdap vaccine if she has not done so within the past 10 years.    Outpatient Encounter Medications as of 02/11/2023  Medication Sig   losartan (COZAAR) 50 MG tablet Take 1 tablet (50 mg total) by mouth daily.   metoprolol succinate (TOPROL-XL) 25 MG 24 hr tablet Take 1 tablet (25 mg total) by mouth daily.   [DISCONTINUED] losartan (COZAAR) 50 MG tablet Take 1 tablet (50 mg total) by mouth daily. (Patient not taking: Reported on 02/11/2023)   [DISCONTINUED] metoprolol succinate (TOPROL-XL) 25 MG 24 hr tablet Take 1 tablet (25 mg total) by mouth daily. (Patient not taking: Reported on 02/11/2023)   No facility-administered encounter medications on file as of 02/11/2023.    Past Medical History:  Diagnosis Date   Allergy    Anemia    Anxiety    Depression    Hypertension    Sleep apnea     Past Surgical History:  Procedure Laterality Date   CHOLECYSTECTOMY N/A 07/14/2017   Procedure: LAPAROSCOPIC CHOLECYSTECTOMY WITH INTRAOPERATIVE CHOLANGIOGRAM;  Surgeon: Donnie Mesa, MD;  Location: [facility] OR;  Service: General;  Laterality: N/A;   TOOTH  EXTRACTION      Family History  Problem Relation Age of Onset   Diabetes Maternal Grandfather    Hypertension Maternal Grandfather    Cancer Paternal Grandmother    Diabetes Paternal Grandmother    Hypertension Paternal Grandmother    Cancer Other     Social History   Socioeconomic History   Marital status: Single    Spouse name: Not on file   Number of children: Not on file   Years of education: Not on file   Highest education level: Not on file  Occupational History   Not on file  Tobacco Use   Smoking status: Never   Smokeless tobacco: Never  Vaping Use   Vaping Use: Never used  Substance and Sexual Activity   Alcohol use: Yes    Comment: occ   Drug use: No   Sexual activity: Yes    Birth control/protection: Implant  Other Topics Concern   Not on file  Social History Narrative   Not on file   Social Determinants of Health   Financial Resource Strain: Not on file  Food Insecurity: No Food Insecurity (03/20/2022)   Hunger Vital Sign    Worried About Running Out of Food in the Last Year: Never true    Ran Out of Food in the Last Year: Never true  Transportation Needs: Not on file  Physical Activity: Not on  file  Stress: Not on file  Social Connections: Not on file  Intimate Partner Violence: Not on file    Review of Systems  Constitutional: Negative.   Respiratory: Negative.    Cardiovascular: Negative.   Gastrointestinal: Negative.   Musculoskeletal: Negative.   Neurological: Negative.   Psychiatric/Behavioral: Negative.          Objective    BP 133/73   Pulse 98   Temp (!) 97.2 F (36.2 C)   Ht '5\' 1"'$  (1.549 m)   Wt (!) 379 lb 6.4 oz (172.1 kg)   LMP 02/11/2023 (Approximate)   SpO2 99%   BMI 71.69 kg/m   Physical Exam Constitutional:      General: She is not in acute distress.    Appearance: She is obese. She is not ill-appearing, toxic-appearing or diaphoretic.  Eyes:     General:        Right eye: No discharge.        Left eye:  No discharge.  Cardiovascular:     Rate and Rhythm: Normal rate and regular rhythm.     Pulses: Normal pulses.     Heart sounds: Normal heart sounds. No murmur heard.    No friction rub. No gallop.  Pulmonary:     Effort: Pulmonary effort is normal. No respiratory distress.     Breath sounds: Normal breath sounds. No stridor. No wheezing, rhonchi or rales.  Chest:     Chest wall: No tenderness.  Abdominal:     General: There is no distension.     Palpations: Abdomen is soft.     Tenderness: There is no abdominal tenderness. There is no guarding.  Musculoskeletal:        General: No tenderness, deformity or signs of injury.     Right lower leg: No edema.     Left lower leg: No edema.  Skin:    General: Skin is warm and dry.     Coloration: Skin is not jaundiced.     Findings: No bruising.  Neurological:     Mental Status: She is oriented to person, place, and time.     Cranial Nerves: No cranial nerve deficit.     Motor: No weakness.     Coordination: Coordination normal.     Gait: Gait normal.         Assessment & Plan:   Problem List Items Addressed This Visit       Cardiovascular and Mediastinum   Hypertension    BP Readings from Last 3 Encounters:  02/11/23 133/73  01/04/22 139/90  12/17/21 (!) 141/87  Losartan 50 mg daily, metoprolol 25 mg daily refilled Patient encouraged to monitor blood pressure at home and call the office to report hypertension. DASH diet advised engage in regular moderate to vigorous exercise with at least 150 minutes weekly Follow-up in 4 weeks CMP today      Relevant Medications   losartan (COZAAR) 50 MG tablet   metoprolol succinate (TOPROL-XL) 25 MG 24 hr tablet   Other Relevant Orders   CMP14+EGFR     Other   Morbid obesity with BMI of 70 and over, adult ([place]) - Primary    Wt Readings from Last 3 Encounters:  02/11/23 (!) 379 lb 6.4 oz (172.1 kg)  01/04/22 (!) 391 lb 4 oz (177.5 kg)  12/17/21 (!) 396 lb 9.6 oz (179.9 kg)    Body mass index is 71.69 kg/m.  Need to increase intake of whole foods consisting mainly vegetables and  protein less carbohydrate drinking at least 64 ounces of water daily engaging in regular moderate to vigorous exercises at least 150 minutes weekly, importance of portion control also discussed. Benefits of healthy weight discussed Patient referred to the medical weight management clinic, she might benefit from bariatric surgery.      Relevant Orders   Amb Ref to Medical Weight Management   Other Visit Diagnoses     Tachycardia with hypertension       Relevant Medications   metoprolol succinate (TOPROL-XL) 25 MG 24 hr tablet       Return in about 4 weeks (around 03/11/2023) for HTN/CPE.   Renee Rival, FNP

## 2023-02-11 NOTE — Assessment & Plan Note (Addendum)
Wt Readings from Last 3 Encounters:  02/11/23 (!) 379 lb 6.4 oz (172.1 kg)  01/04/22 (!) 391 lb 4 oz (177.5 kg)  12/17/21 (!) 396 lb 9.6 oz (179.9 kg)   Body mass index is 71.69 kg/m.  Need to increase intake of whole foods consisting mainly vegetables and protein less carbohydrate drinking at least 64 ounces of water daily engaging in regular moderate to vigorous exercises at least 150 minutes weekly, importance of portion control also discussed. Benefits of healthy weight discussed Patient referred to the medical weight management clinic, she might benefit from bariatric surgery.

## 2023-02-11 NOTE — Assessment & Plan Note (Signed)
BP Readings from Last 3 Encounters:  02/11/23 133/73  01/04/22 139/90  12/17/21 (!) 141/87  Losartan 50 mg daily, metoprolol 25 mg daily refilled Patient encouraged to monitor blood pressure at home and call the office to report hypertension. DASH diet advised engage in regular moderate to vigorous exercise with at least 150 minutes weekly Follow-up in 4 weeks CMP today

## 2023-03-12 ENCOUNTER — Ambulatory Visit: Payer: Medicaid Other | Admitting: Nurse Practitioner

## 2023-03-18 ENCOUNTER — Ambulatory Visit: Payer: Medicaid Other | Admitting: Nurse Practitioner

## 2023-03-31 ENCOUNTER — Ambulatory Visit: Payer: Medicaid Other | Admitting: Nurse Practitioner

## 2023-05-31 ENCOUNTER — Other Ambulatory Visit: Payer: Self-pay | Admitting: Nurse Practitioner

## 2023-05-31 DIAGNOSIS — R000 Tachycardia, unspecified: Secondary | ICD-10-CM

## 2023-05-31 DIAGNOSIS — I10 Essential (primary) hypertension: Secondary | ICD-10-CM

## 2023-06-02 ENCOUNTER — Other Ambulatory Visit: Payer: Self-pay | Admitting: Nurse Practitioner

## 2023-06-02 DIAGNOSIS — I10 Essential (primary) hypertension: Secondary | ICD-10-CM

## 2023-06-02 NOTE — Telephone Encounter (Signed)
Pt needs follow up appointment

## 2023-08-11 ENCOUNTER — Ambulatory Visit (INDEPENDENT_AMBULATORY_CARE_PROVIDER_SITE_OTHER): Payer: Medicaid Other | Admitting: Nurse Practitioner

## 2023-08-11 ENCOUNTER — Encounter: Payer: Self-pay | Admitting: Nurse Practitioner

## 2023-08-11 VITALS — BP 132/84 | HR 104 | Temp 97.1°F | Wt >= 6400 oz

## 2023-08-11 DIAGNOSIS — H6123 Impacted cerumen, bilateral: Secondary | ICD-10-CM | POA: Insufficient documentation

## 2023-08-11 DIAGNOSIS — G4700 Insomnia, unspecified: Secondary | ICD-10-CM | POA: Diagnosis not present

## 2023-08-11 DIAGNOSIS — Z13228 Encounter for screening for other metabolic disorders: Secondary | ICD-10-CM

## 2023-08-11 DIAGNOSIS — Z0000 Encounter for general adult medical examination without abnormal findings: Secondary | ICD-10-CM | POA: Insufficient documentation

## 2023-08-11 DIAGNOSIS — R0683 Snoring: Secondary | ICD-10-CM | POA: Insufficient documentation

## 2023-08-11 DIAGNOSIS — Z130 Encounter for screening for diseases of the blood and blood-forming organs and certain disorders involving the immune mechanism: Secondary | ICD-10-CM

## 2023-08-11 DIAGNOSIS — I10 Essential (primary) hypertension: Secondary | ICD-10-CM

## 2023-08-11 DIAGNOSIS — Z6841 Body Mass Index (BMI) 40.0 and over, adult: Secondary | ICD-10-CM

## 2023-08-11 DIAGNOSIS — Z1329 Encounter for screening for other suspected endocrine disorder: Secondary | ICD-10-CM

## 2023-08-11 DIAGNOSIS — Z1321 Encounter for screening for nutritional disorder: Secondary | ICD-10-CM

## 2023-08-11 MED ORDER — DEBROX 6.5 % OT SOLN
5.0000 [drp] | Freq: Two times a day (BID) | OTIC | 0 refills | Status: DC
Start: 2023-08-11 — End: 2024-03-16

## 2023-08-11 MED ORDER — SEMAGLUTIDE-WEIGHT MANAGEMENT 0.25 MG/0.5ML ~~LOC~~ SOAJ
0.2500 mg | SUBCUTANEOUS | 0 refills | Status: AC
Start: 2023-08-11 — End: 2023-09-08

## 2023-08-11 NOTE — Assessment & Plan Note (Signed)
BP Readings from Last 3 Encounters:  08/11/23 132/84  02/11/23 133/73  01/04/22 139/90  Has losartan and metoprolol ordered but has not been taking the medication Blood pressure currently well-controlled without medication Patient encouraged to check her blood pressure at home keep a log and bring to next visit We will not restart medication since her blood pressure is controlled without medication, will restart medication if her blood pressure becomes uncontrolled Discussed DASH diet and dietary sodium restrictions Continue to increase dietary efforts and exercise.

## 2023-08-11 NOTE — Assessment & Plan Note (Signed)
Annual exam as documented.  Counseling done include healthy lifestyle involving committing to 150 minutes of exercise per week, heart healthy diet, and attaining healthy weight. The importance of adequate sleep also discussed.  Regular use of seat belt and home safety were also discussed . Changes in health habits are decided on by patient with goals and time frames set for achieving them. Immunization and cancer screening  needs are specifically addressed at this visit.    Routine fasting labs ordered Encouraged to get Tdap vaccine at the pharmacy if she has not had vaccine the past 10 years Patient encouraged to consider getting flu vaccine

## 2023-08-11 NOTE — Progress Notes (Signed)
Complete physical exam  Patient: Dana Burnett   DOB: [birth year]   32 y.o. Female  MRN: [id number]  Subjective:    Chief Complaint  Patient presents with   Weight Loss    Dana Burnett is a 32 y.o. female  has a past medical history of Allergy, Anemia, Anxiety, Depression, Hypertension, and Sleep apnea. who presents today for a complete physical exam.  Patient stated that she has changed her eating habits by ''cutting out soda, also moving a little bit more''  Sleep apnea .has history of sleep apnea but stated that she has never had a sleep study done.  She complains of snoring get 4 to 5 hours of sleep nightly wakes up in between, does not feel well rested in the morning after getting up.  Hypertension.  She has losartan 50 mg daily and metoprolol 25 mg daily ordered.  She stated that she has not taking both medications in the past 2 months.  She denies chest pain, dizziness, sometimes has lower extremity edema.  Has a blood pressure cuff at home but does not check her blood pressure.  Due for flu vaccine flu vaccine declined.  Patient encouraged to consider getting the vaccine  She is up-to-date with her yearly eye exam.     Most recent fall risk assessment:    02/11/2023   10:30 AM  Fall Risk   Falls in the past year? 0     Most recent depression screenings:    08/11/2023    8:20 AM 02/11/2023   10:30 AM  PHQ 2/9 Scores  PHQ - 2 Score 0 0  PHQ- 9 Score  3        Patient Care Team: Donell Beers, FNP as PCP - General (Nurse Practitioner)   Outpatient Medications Prior to Visit  Medication Sig   losartan (COZAAR) 50 MG tablet TAKE 1 TABLET BY MOUTH EVERY DAY   metoprolol succinate (TOPROL-XL) 25 MG 24 hr tablet TAKE 1 TABLET BY MOUTH DAILY   No facility-administered medications prior to visit.    Review of Systems  Constitutional:  Negative for activity change, appetite change, chills, fatigue and fever.  HENT:  Negative for congestion, dental  problem, ear discharge, ear pain, hearing loss, rhinorrhea, sinus pressure, sinus pain, sneezing and sore throat.   Eyes:  Negative for pain, discharge, redness and itching.  Respiratory:  Negative for cough, chest tightness, shortness of breath and wheezing.   Cardiovascular:  Positive for leg swelling. Negative for chest pain and palpitations.  Gastrointestinal:  Negative for abdominal distention, abdominal pain, anal bleeding, blood in stool, constipation, diarrhea, nausea, rectal pain and vomiting.  Endocrine: Negative for polydipsia, polyphagia and polyuria.  Genitourinary:  Negative for difficulty urinating, dysuria, flank pain, frequency, hematuria, menstrual problem, pelvic pain and vaginal bleeding.  Musculoskeletal:  Negative for arthralgias, back pain, gait problem, joint swelling and myalgias.  Skin:  Negative for color change, pallor, rash and wound.  Allergic/Immunologic: Negative for environmental allergies, food allergies and immunocompromised state.  Neurological:  Negative for dizziness, tremors, facial asymmetry, weakness and headaches.  Hematological:  Negative for adenopathy. Does not bruise/bleed easily.  Psychiatric/Behavioral:  Negative for agitation, behavioral problems, confusion, decreased concentration, hallucinations, self-injury and suicidal ideas.        Objective:     BP 132/84   Pulse (!) 104   Temp (!) 97.1 F (36.2 C)   Wt (!) 401 lb (181.9 kg)   SpO2 100%   BMI 75.77 kg/m  Physical Exam Vitals and nursing note reviewed. Exam conducted with a chaperone present.  Constitutional:      General: She is not in acute distress.    Appearance: Normal appearance. She is obese. She is not ill-appearing, toxic-appearing or diaphoretic.  HENT:     Right Ear: Ear canal and external ear normal. There is impacted cerumen.     Left Ear: Ear canal and external ear normal. There is impacted cerumen.     Nose: Nose normal. No congestion or rhinorrhea.      Mouth/Throat:     Mouth: Mucous membranes are moist.     Pharynx: Oropharynx is clear. No oropharyngeal exudate or posterior oropharyngeal erythema.  Eyes:     General: No scleral icterus.       Right eye: No discharge.        Left eye: No discharge.     Extraocular Movements: Extraocular movements intact.     Conjunctiva/sclera: Conjunctivae normal.  Neck:     Vascular: No carotid bruit.  Cardiovascular:     Rate and Rhythm: Normal rate and regular rhythm.     Pulses: Normal pulses.     Heart sounds: Normal heart sounds. No murmur heard.    No friction rub. No gallop.  Pulmonary:     Effort: Pulmonary effort is normal. No respiratory distress.     Breath sounds: Normal breath sounds. No stridor. No wheezing, rhonchi or rales.  Chest:     Chest wall: No tenderness.  Abdominal:     General: Bowel sounds are normal. There is no distension.     Palpations: Abdomen is soft. There is no mass.     Tenderness: There is no abdominal tenderness. There is no right CVA tenderness, left CVA tenderness, guarding or rebound.     Hernia: No hernia is present.  Musculoskeletal:        General: No swelling, tenderness, deformity or signs of injury.     Cervical back: Normal range of motion and neck supple. No rigidity or tenderness.     Right lower leg: No edema.     Left lower leg: No edema.  Lymphadenopathy:     Cervical: No cervical adenopathy.  Skin:    General: Skin is warm and dry.     Capillary Refill: Capillary refill takes less than 2 seconds.     Coloration: Skin is not jaundiced or pale.     Findings: No bruising, erythema, lesion or rash.  Neurological:     Mental Status: She is alert and oriented to person, place, and time.     Cranial Nerves: No cranial nerve deficit.     Sensory: No sensory deficit.     Motor: No weakness.     Coordination: Coordination normal.     Gait: Gait normal.     Deep Tendon Reflexes: Reflexes normal.  Psychiatric:        Mood and Affect: Mood  normal.        Behavior: Behavior normal.        Thought Content: Thought content normal.        Judgment: Judgment normal.     No results found for any visits on 08/11/23.     Assessment & Plan:    Routine Health Maintenance and Physical Exam   There is no immunization history on file for this patient.  Health Maintenance  Topic Date Due   Hepatitis C Screening  Never done   DTaP/Tdap/Td (1 - Tdap) Never done  COVID-19 Vaccine (1 - 2023-24 season) Never done   INFLUENZA VACCINE  03/01/2024 (Originally 07/03/2023)   PAP SMEAR-Modifier  01/04/2025   HIV Screening  Completed   HPV VACCINES  Aged Out    Discussed health benefits of physical activity, and encouraged her to engage in regular exercise appropriate for her age and condition.  Problem List Items Addressed This Visit       Cardiovascular and Mediastinum   Hypertension    BP Readings from Last 3 Encounters:  08/11/23 132/84  02/11/23 133/73  01/04/22 139/90  Has losartan and metoprolol ordered but has not been taking the medication Blood pressure currently well-controlled without medication Patient encouraged to check her blood pressure at home keep a log and bring to next visit We will not restart medication since her blood pressure is controlled without medication, will restart medication if her blood pressure becomes uncontrolled Discussed DASH diet and dietary sodium restrictions Continue to increase dietary efforts and exercise.           Nervous and Auditory   Excessive ear wax, bilateral     - carbamide peroxide (DEBROX) 6.5 % OTIC solution; Place 5 drops into both ears 2 (two) times daily.  Dispense: 15 mL; Refill: 0        Relevant Medications   carbamide peroxide (DEBROX) 6.5 % OTIC solution     Other   Morbid obesity with BMI of 70 and over, adult ([facility])    Wt Readings from Last 3 Encounters:  08/11/23 (!) 401 lb (181.9 kg)  02/11/23 (!) 379 lb 6.4 oz (172.1 kg)  01/04/22 (!) 391 lb 4 oz  (177.5 kg)   Body mass index is 75.77 kg/m.  Patient has gained 22 pounds since her last visit, she was referred to the medical weight management clinic but she has not followed up with them.  Their phone number provided today patient encouraged to follow-up with them.  She declined referral for bariatric surgery Starting Wegovy 0.25 mg once weekly injection, denies family history of medullary thyroid cancer, no personal history of pancreatitis.  Patient encouraged to report abdominal pain nausea vomiting.  Encouraged to eat smaller portions of meal avoid fatty fried foods to help prevent nausea. Patient counseled on low-carb modified diet Encouraged to engage in regular moderate to vigorous exercises at least 150 minutes weekly Benefits of healthy weights discussed Follow-up in 4 weeks      Relevant Medications   Semaglutide-Weight Management 0.25 MG/0.5ML SOAJ   Insomnia - Primary    Home sleep study ordered to screen for sleep apnea      Relevant Orders   Home sleep test   Snoring    Home sleep study ordered Working on losing weight      Relevant Orders   Home sleep test   Annual physical exam    Annual exam as documented.  Counseling done include healthy lifestyle involving committing to 150 minutes of exercise per week, heart healthy diet, and attaining healthy weight. The importance of adequate sleep also discussed.  Regular use of seat belt and home safety were also discussed . Changes in health habits are decided on by patient with goals and time frames set for achieving them. Immunization and cancer screening  needs are specifically addressed at this visit.    Routine fasting labs ordered Encouraged to get Tdap vaccine at the pharmacy if she has not had vaccine the past 10 years Patient encouraged to consider getting flu vaccine  Other Visit Diagnoses     Screening for endocrine, nutritional, metabolic and immunity disorder       Relevant Orders   CBC    CMP14+EGFR   Hemoglobin A1c   Hepatitis C antibody   Lipid panel   TSH      Return in about 4 weeks (around 09/08/2023) for weight management , HTN.     Donell Beers, FNP

## 2023-08-11 NOTE — Assessment & Plan Note (Addendum)
Home sleep study ordered Working on losing weight

## 2023-08-11 NOTE — Patient Instructions (Addendum)
Please call the medical weight management clinic on [phone] appointment  Please get your Tdap vaccine at the pharmacy  1. Insomnia, unspecified type   2. Snoring   3. Morbid obesity with BMI of 70 and over, adult ([facility])  - Semaglutide-Weight Management 0.25 MG/0.5ML SOAJ; Inject 0.25 mg into the skin once a week for 28 days.  Dispense: 2 mL; Refill: 0    It is important that you exercise regularly at least 30 minutes 5 times a week as tolerated  Think about what you will eat, plan ahead. Choose " clean, green, fresh or frozen" over canned, processed or packaged foods which are more sugary, salty and fatty. 70 to 75% of food eaten should be vegetables and fruit. Three meals at set times with snacks allowed between meals, but they must be fruit or vegetables. Aim to eat over a 12 hour period , example 7 am to 7 pm, and STOP after  your last meal of the day. Drink water,generally about 64 ounces per day, no other drink is as healthy. Fruit juice is best enjoyed in a healthy way, by EATING the fruit.  Thanks for choosing [facility] we consider it a privelige to serve you.

## 2023-08-11 NOTE — Assessment & Plan Note (Signed)
Home sleep study ordered to screen for sleep apnea

## 2023-08-11 NOTE — Assessment & Plan Note (Signed)
- 

## 2023-08-11 NOTE — Assessment & Plan Note (Signed)
Wt Readings from Last 3 Encounters:  08/11/23 (!) 401 lb (181.9 kg)  02/11/23 (!) 379 lb 6.4 oz (172.1 kg)  01/04/22 (!) 391 lb 4 oz (177.5 kg)   Body mass index is 75.77 kg/m.  Patient has gained 22 pounds since her last visit, she was referred to the medical weight management clinic but she has not followed up with them.  Their phone number provided today patient encouraged to follow-up with them.  She declined referral for bariatric surgery Starting Wegovy 0.25 mg once weekly injection, denies family history of medullary thyroid cancer, no personal history of pancreatitis.  Patient encouraged to report abdominal pain nausea vomiting.  Encouraged to eat smaller portions of meal avoid fatty fried foods to help prevent nausea. Patient counseled on low-carb modified diet Encouraged to engage in regular moderate to vigorous exercises at least 150 minutes weekly Benefits of healthy weights discussed Follow-up in 4 weeks

## 2023-08-12 ENCOUNTER — Other Ambulatory Visit: Payer: Self-pay

## 2023-08-12 ENCOUNTER — Other Ambulatory Visit: Payer: Self-pay | Admitting: Nurse Practitioner

## 2023-08-12 DIAGNOSIS — D649 Anemia, unspecified: Secondary | ICD-10-CM

## 2023-08-12 LAB — CMP14+EGFR
ALT: 13 IU/L (ref 0–32)
AST: 17 IU/L (ref 0–40)
Albumin: 3.7 g/dL — ABNORMAL LOW (ref 4.0–5.0)
Alkaline Phosphatase: 72 IU/L (ref 44–121)
BUN/Creatinine Ratio: 16 (ref 9–23)
BUN: 13 mg/dL (ref 6–20)
Bilirubin Total: 0.2 mg/dL (ref 0.0–1.2)
CO2: 25 mmol/L (ref 20–29)
Calcium: 9.2 mg/dL (ref 8.7–10.2)
Chloride: 99 mmol/L (ref 96–106)
Creatinine, Ser: 0.8 mg/dL (ref 0.57–1.00)
Globulin, Total: 3.2 g/dL (ref 1.5–4.5)
Glucose: 89 mg/dL (ref 70–99)
Potassium: 4.6 mmol/L (ref 3.5–5.2)
Sodium: 137 mmol/L (ref 134–144)
Total Protein: 6.9 g/dL (ref 6.0–8.5)
eGFR: 102 mL/min/{1.73_m2} (ref >59)

## 2023-08-12 LAB — CBC
Hematocrit: 33.7 % — ABNORMAL LOW (ref 34.0–46.6)
Hemoglobin: 8.8 g/dL — ABNORMAL LOW (ref 11.1–15.9)
MCH: 16.8 pg — ABNORMAL LOW (ref 26.6–33.0)
MCHC: 26.1 g/dL — ABNORMAL LOW (ref 31.5–35.7)
MCV: 64 fL — ABNORMAL LOW (ref 79–97)
Platelets: 354 10*3/uL (ref 150–450)
RBC: 5.25 x10E6/uL (ref 3.77–5.28)
RDW: 19.3 % — ABNORMAL HIGH (ref 11.7–15.4)
WBC: 7.7 10*3/uL (ref 3.4–10.8)

## 2023-08-12 LAB — LIPID PANEL
Chol/HDL Ratio: 2.3 ratio (ref 0.0–4.4)
Cholesterol, Total: 139 mg/dL (ref 100–199)
HDL: 61 mg/dL (ref >39)
LDL Chol Calc (NIH): 66 mg/dL (ref 0–99)
Triglycerides: 54 mg/dL (ref 0–149)
VLDL Cholesterol Cal: 12 mg/dL (ref 5–40)

## 2023-08-12 LAB — HEMOGLOBIN A1C
Est. average glucose Bld gHb Est-mCnc: 123 mg/dL
Hgb A1c MFr Bld: 5.9 % — ABNORMAL HIGH (ref 4.8–5.6)

## 2023-08-12 LAB — HEPATITIS C ANTIBODY: Hep C Virus Ab: NONREACTIVE

## 2023-08-12 LAB — TSH: TSH: 1.6 u[IU]/mL (ref 0.450–4.500)

## 2023-08-12 NOTE — Telephone Encounter (Signed)
Thanks [employer]

## 2023-08-13 ENCOUNTER — Telehealth: Payer: Self-pay | Admitting: Nurse Practitioner

## 2023-08-13 ENCOUNTER — Other Ambulatory Visit: Payer: Self-pay | Admitting: Nurse Practitioner

## 2023-08-13 DIAGNOSIS — D508 Other iron deficiency anemias: Secondary | ICD-10-CM

## 2023-08-13 MED ORDER — FERROUS SULFATE 325 (65 FE) MG PO TABS
325.0000 mg | ORAL_TABLET | Freq: Every day | ORAL | 1 refills | Status: DC
Start: 2023-08-13 — End: 2024-03-16

## 2023-08-13 NOTE — Telephone Encounter (Signed)
Pt called requesing someone call her regarding her medication. Can call back at [phone]

## 2023-08-13 NOTE — Telephone Encounter (Signed)
Pt advised that her P/A is being worked on. KH

## 2023-08-13 NOTE — Progress Notes (Signed)
Iron deficiency anemia.  Please start taking ferrous sulfate 325 mg daily.Take with foods/supplements that may increase absorption examples Vitamin C  , Orange juice  Improve absorption   Take separately from foods that may impair absorption Meals Coffee Eggs Oxalates (spinach, kale, beets, nuts, chocolate, tea, wheat bran, rhubarb, strawberries, some herbs) Phytates (soy, fiber, cereals, some nuts, beans, lentils, peas) Tannates (tea, cocoa, some spices, some berries) Calcium (milk, yogurt, cheese, some greens, fish) Minimize exposure to medications that decrease gastric acidity Antacids (take iron 2 hours before or 4 hours after the antacid) Histamine receptor blockers (discontinue if no longer needed) Proton pump inhibitors (discontinue if no longer needed)

## 2023-08-14 ENCOUNTER — Other Ambulatory Visit: Payer: Self-pay

## 2023-08-15 ENCOUNTER — Other Ambulatory Visit: Payer: Self-pay

## 2023-08-15 ENCOUNTER — Telehealth: Payer: Self-pay

## 2023-08-15 NOTE — Telephone Encounter (Signed)
Pharmacy Patient Advocate Encounter  Received notification from [facility] MEDICAID that Prior Authorization for [facility] has been APPROVED from 08/14/2023 to 02/11/2024   PA #/Case ID/Reference #: [id number]

## 2023-09-04 ENCOUNTER — Encounter: Payer: Medicaid Other | Admitting: Nurse Practitioner

## 2023-09-08 ENCOUNTER — Ambulatory Visit: Payer: Medicaid Other | Admitting: Nurse Practitioner

## 2023-09-09 ENCOUNTER — Ambulatory Visit (INDEPENDENT_AMBULATORY_CARE_PROVIDER_SITE_OTHER): Payer: Medicaid Other | Admitting: Nurse Practitioner

## 2023-09-09 ENCOUNTER — Encounter: Payer: Self-pay | Admitting: Nurse Practitioner

## 2023-09-09 VITALS — BP 134/74 | HR 114 | Ht 61.0 in | Wt 390.0 lb

## 2023-09-09 DIAGNOSIS — Z6841 Body Mass Index (BMI) 40.0 and over, adult: Secondary | ICD-10-CM

## 2023-09-09 DIAGNOSIS — I10 Essential (primary) hypertension: Secondary | ICD-10-CM

## 2023-09-09 DIAGNOSIS — G4700 Insomnia, unspecified: Secondary | ICD-10-CM | POA: Diagnosis not present

## 2023-09-09 DIAGNOSIS — D509 Iron deficiency anemia, unspecified: Secondary | ICD-10-CM | POA: Diagnosis not present

## 2023-09-09 MED ORDER — SEMAGLUTIDE-WEIGHT MANAGEMENT 1 MG/0.5ML ~~LOC~~ SOAJ
1.0000 mg | SUBCUTANEOUS | 0 refills | Status: AC
Start: 2023-10-18 — End: 2023-11-15

## 2023-09-09 MED ORDER — SEMAGLUTIDE-WEIGHT MANAGEMENT 0.5 MG/0.5ML ~~LOC~~ SOAJ
0.5000 mg | SUBCUTANEOUS | 0 refills | Status: DC
Start: 2023-09-13 — End: 2023-09-12
  Filled 2023-09-11: qty 2, 28d supply, fill #0

## 2023-09-09 MED ORDER — SEMAGLUTIDE-WEIGHT MANAGEMENT 1.7 MG/0.75ML ~~LOC~~ SOAJ
1.7000 mg | SUBCUTANEOUS | 0 refills | Status: DC
Start: 2023-11-22 — End: 2023-12-17

## 2023-09-09 MED ORDER — SEMAGLUTIDE-WEIGHT MANAGEMENT 1 MG/0.5ML ~~LOC~~ SOAJ
1.0000 mg | SUBCUTANEOUS | 0 refills | Status: DC
Start: 2023-11-06 — End: 2023-09-09

## 2023-09-09 MED ORDER — SEMAGLUTIDE-WEIGHT MANAGEMENT 1.7 MG/0.75ML ~~LOC~~ SOAJ
1.7000 mg | SUBCUTANEOUS | 0 refills | Status: DC
Start: 2023-12-05 — End: 2023-09-09

## 2023-09-09 NOTE — Assessment & Plan Note (Addendum)
Lab Results  Component Value Date   WBC 7.7 08/11/2023   HGB 8.8 (L) 08/11/2023   HCT 33.7 (L) 08/11/2023   MCV 64 (L) 08/11/2023   PLT 354 08/11/2023  Has ferrous sulfate 325 mg daily ordered but she has not been taking the medication She reports heavy menses lasting 7 days monthly, does not want referral to OB/GYN Encouraged to start taking first sulfate 325 mg daily, take it with vitamin C to improve absorption.  Increase intake of fiber and take over-the-counter stool softener for constipation if needed.

## 2023-09-09 NOTE — Progress Notes (Signed)
Established Patient Office Visit  Subjective:  Patient ID: Dana Burnett, female    DOB: 1991-05-17  Age: 32 y.o. MRN: [id number]  CC:  Chief Complaint  Patient presents with   Hypertension   Obesity    HPI Dana Burnett is a 32 y.o. female  has a past medical history of Allergy, Anemia, Anxiety, Depression, Hypertension, and Sleep apnea.  Patient presents for follow-up for obesity She denies any adverse reactions to current medications Please see assessment and plan section for full HPI   Past Medical History:  Diagnosis Date   Allergy    Anemia    Anxiety    Depression    Hypertension    Sleep apnea     Past Surgical History:  Procedure Laterality Date   CHOLECYSTECTOMY N/A 07/14/2017   Procedure: LAPAROSCOPIC CHOLECYSTECTOMY WITH INTRAOPERATIVE CHOLANGIOGRAM;  Surgeon: Manus Rudd, MD;  Location: [facility] OR;  Service: General;  Laterality: N/A;   TOOTH EXTRACTION      Family History  Problem Relation Age of Onset   HIV/AIDS Mother    Drug abuse Father    Stomach cancer Paternal Aunt    Diabetes Maternal Grandfather    Hypertension Maternal Grandfather    Cancer Paternal Grandmother    Diabetes Paternal Grandmother    Hypertension Paternal Grandmother    Lung cancer Paternal Grandmother    Cancer Other     Social History   Socioeconomic History   Marital status: Single    Spouse name: Not on file   Number of children: Not on file   Years of education: Not on file   Highest education level: 12th grade  Occupational History   Not on file  Tobacco Use   Smoking status: Never   Smokeless tobacco: Never  Vaping Use   Vaping status: Never Used  Substance and Sexual Activity   Alcohol use: Yes    Comment: occ   Drug use: No   Sexual activity: Yes    Birth control/protection: Implant  Other Topics Concern   Not on file  Social History Narrative   Lives with her partner    Social Determinants of Health   Financial Resource Strain: Patient  Declined (08/07/2023)   Overall Financial Resource Strain (CARDIA)    Difficulty of Paying Living Expenses: Patient declined  Food Insecurity: Patient Declined (08/07/2023)   Hunger Vital Sign    Worried About Running Out of Food in the Last Year: Patient declined    Ran Out of Food in the Last Year: Patient declined  Transportation Needs: No Transportation Needs (08/07/2023)   PRAPARE - Administrator, Civil Service (Medical): No    Lack of Transportation (Non-Medical): No  Physical Activity: Unknown (08/07/2023)   Exercise Vital Sign    Days of Exercise per Week: 0 days    Minutes of Exercise per Session: Not on file  Stress: Stress Concern Present (08/07/2023)   [employer] of Occupational Health - Occupational Stress Questionnaire    Feeling of Stress : Rather much  Social Connections: Socially Isolated (08/07/2023)   Social Connection and Isolation Panel [NHANES]    Frequency of Communication with Friends and Family: Twice a week    Frequency of Social Gatherings with Friends and Family: Never    Attends Religious Services: Never    Database administrator or Organizations: No    Attends Engineer, structural: Not on file    Marital Status: Living with partner  Intimate Partner Violence:  Not on file    Outpatient Medications Prior to Visit  Medication Sig Dispense Refill   carbamide peroxide (DEBROX) 6.5 % OTIC solution Place 5 drops into both ears 2 (two) times daily. 15 mL 0   ferrous sulfate 325 (65 FE) MG tablet Take 1 tablet (325 mg total) by mouth daily before breakfast. 90 tablet 1   losartan (COZAAR) 50 MG tablet TAKE 1 TABLET BY MOUTH EVERY DAY 30 tablet 0   metoprolol succinate (TOPROL-XL) 25 MG 24 hr tablet TAKE 1 TABLET BY MOUTH DAILY 30 tablet 0   WEGOVY 0.25 MG/0.5ML SOAJ Inject 0.25 mg into the skin once a week.     No facility-administered medications prior to visit.    Allergies  Allergen Reactions   Shellfish Allergy Anaphylaxis     ROS Review of Systems    Objective:    Physical Exam  BP 134/74   Pulse (!) 114   Ht 5\' 1"  (1.549 m)   Wt (!) 390 lb (176.9 kg)   LMP 09/04/2023   SpO2 99%   BMI 73.69 kg/m  Wt Readings from Last 3 Encounters:  09/09/23 (!) 390 lb (176.9 kg)  08/11/23 (!) 401 lb (181.9 kg)  02/11/23 (!) 379 lb 6.4 oz (172.1 kg)    Lab Results  Component Value Date   TSH 1.600 08/11/2023   Lab Results  Component Value Date   WBC 7.7 08/11/2023   HGB 8.8 (L) 08/11/2023   HCT 33.7 (L) 08/11/2023   MCV 64 (L) 08/11/2023   PLT 354 08/11/2023   Lab Results  Component Value Date   NA 137 08/11/2023   K 4.6 08/11/2023   CO2 25 08/11/2023   GLUCOSE 89 08/11/2023   BUN 13 08/11/2023   CREATININE 0.80 08/11/2023   BILITOT 0.2 08/11/2023   ALKPHOS 72 08/11/2023   AST 17 08/11/2023   ALT 13 08/11/2023   PROT 6.9 08/11/2023   ALBUMIN 3.7 (L) 08/11/2023   CALCIUM 9.2 08/11/2023   ANIONGAP 9 11/05/2020   EGFR 102 08/11/2023   Lab Results  Component Value Date   CHOL 139 08/11/2023   Lab Results  Component Value Date   HDL 61 08/11/2023   Lab Results  Component Value Date   LDLCALC 66 08/11/2023   Lab Results  Component Value Date   TRIG 54 08/11/2023   Lab Results  Component Value Date   CHOLHDL 2.3 08/11/2023   Lab Results  Component Value Date   HGBA1C 5.9 (H) 08/11/2023      Assessment & Plan:   Problem List Items Addressed This Visit       Cardiovascular and Mediastinum   Hypertension    BP Readings from Last 3 Encounters:  09/09/23 134/74  08/11/23 132/84  02/11/23 133/73  Currently well-controlled without medication Continue to monitor blood pressure at home and reports blood pressure readings greater than 140/90 DASH diet advised, continue to intensify efforts at losing weight        Other   Morbid obesity with BMI of 70 and over, adult (HCC) - Primary    Wt Readings from Last 3 Encounters:  09/09/23 (!) 390 lb (176.9 kg)  08/11/23 (!)  401 lb (181.9 kg)  02/11/23 (!) 379 lb 6.4 oz (172.1 kg)  Patient has lost about 11 pounds since she started Ozempic 0.25 mg injection Patient denies any adverse reaction to this medication She has not started exercising, stated that she has been trying to eat better and not eating  out as much Patient congratulated on her efforts at losing weight Patient counseled on low-carb modified diet Encouraged to engage in regular to moderate exercises at least 150 minutes weekly. I encouraged the patient to call the office if she experiences any adverse reactions with higher doses of Wegovy Follow-up in 3 months 1. Morbid obesity with BMI of 70 and over, adult (HCC)  - Semaglutide-Weight Management 0.5 MG/0.5ML SOAJ; Inject 0.5 mg into the skin once a week for 28 days.  Dispense: 2 mL; Refill: 0 - Semaglutide-Weight Management 1 MG/0.5ML SOAJ; Inject 1 mg into the skin once a week for 28 days.  Dispense: 2 mL; Refill: 0 - Semaglutide-Weight Management 1.7 MG/0.75ML SOAJ; Inject 1.7 mg into the skin once a week for 28 days.  Dispense: 3 mL; Refill: 0        Relevant Medications   Semaglutide-Weight Management 0.5 MG/0.5ML SOAJ (Start on 09/13/2023)   Semaglutide-Weight Management 1 MG/0.5ML SOAJ (Start on 10/18/2023)   Semaglutide-Weight Management 1.7 MG/0.75ML SOAJ (Start on 11/22/2023)   Insomnia    She missed the call from the sleep study specialist Patient encouraged to return your call and get the sleep study done as planned      Iron deficiency anemia    Lab Results  Component Value Date   WBC 7.7 08/11/2023   HGB 8.8 (L) 08/11/2023   HCT 33.7 (L) 08/11/2023   MCV 64 (L) 08/11/2023   PLT 354 08/11/2023  Has ferrous sulfate 325 mg daily ordered but she has not been taking the medication She reports heavy menses lasting 7 days monthly, does not want referral to OB/GYN Encouraged to start taking first sulfate 325 mg daily, take it with vitamin C to improve absorption.  Increase intake  of fiber and take over-the-counter stool softener for constipation if needed.       Meds ordered this encounter  Medications   Semaglutide-Weight Management 0.5 MG/0.5ML SOAJ    Sig: Inject 0.5 mg into the skin once a week for 28 days.    Dispense:  2 mL    Refill:  0   DISCONTD: Semaglutide-Weight Management 1 MG/0.5ML SOAJ    Sig: Inject 1 mg into the skin once a week for 28 days.    Dispense:  2 mL    Refill:  0   DISCONTD: Semaglutide-Weight Management 1.7 MG/0.75ML SOAJ    Sig: Inject 1.7 mg into the skin once a week for 28 days.    Dispense:  3 mL    Refill:  0   Semaglutide-Weight Management 1 MG/0.5ML SOAJ    Sig: Inject 1 mg into the skin once a week for 28 days.    Dispense:  2 mL    Refill:  0   Semaglutide-Weight Management 1.7 MG/0.75ML SOAJ    Sig: Inject 1.7 mg into the skin once a week for 28 days.    Dispense:  3 mL    Refill:  0    Follow-up: Return in about 3 months (around 12/10/2023) for obesity.    Donell Beers, FNP

## 2023-09-09 NOTE — Assessment & Plan Note (Addendum)
Wt Readings from Last 3 Encounters:  09/09/23 (!) 390 lb (176.9 kg)  08/11/23 (!) 401 lb (181.9 kg)  02/11/23 (!) 379 lb 6.4 oz (172.1 kg)  Patient has lost about 11 pounds since she started Ozempic 0.25 mg injection Patient denies any adverse reaction to this medication She has not started exercising, stated that she has been trying to eat better and not eating out as much Patient congratulated on her efforts at losing weight Patient counseled on low-carb modified diet Encouraged to engage in regular to moderate exercises at least 150 minutes weekly. I encouraged the patient to call the office if she experiences any adverse reactions with higher doses of Wegovy Follow-up in 3 months 1. Morbid obesity with BMI of 70 and over, adult (HCC)  - Semaglutide-Weight Management 0.5 MG/0.5ML SOAJ; Inject 0.5 mg into the skin once a week for 28 days.  Dispense: 2 mL; Refill: 0 - Semaglutide-Weight Management 1 MG/0.5ML SOAJ; Inject 1 mg into the skin once a week for 28 days.  Dispense: 2 mL; Refill: 0 - Semaglutide-Weight Management 1.7 MG/0.75ML SOAJ; Inject 1.7 mg into the skin once a week for 28 days.  Dispense: 3 mL; Refill: 0

## 2023-09-09 NOTE — Assessment & Plan Note (Signed)
She missed the call from the sleep study specialist Patient encouraged to return your call and get the sleep study done as planned

## 2023-09-09 NOTE — Patient Instructions (Addendum)
Please consider getting your flu vaccine, also getting Tdap vaccine if you have not done so within the past 10 years    1. Morbid obesity with BMI of 70 and over, adult (HCC)  - Semaglutide-Weight Management 0.5 MG/0.5ML SOAJ; Inject 0.5 mg into the skin once a week for 28 days.  Dispense: 2 mL; Refill: 0 - Semaglutide-Weight Management 1 MG/0.5ML SOAJ; Inject 1 mg into the skin once a week for 28 days.  Dispense: 2 mL; Refill: 0 - Semaglutide-Weight Management 1.7 MG/0.75ML SOAJ; Inject 1.7 mg into the skin once a week for 28 days.  Dispense: 3 mL; Refill: 0    It is important that you exercise regularly at least 30 minutes 5 times a week as tolerated  Think about what you will eat, plan ahead. Choose " clean, green, fresh or frozen" over canned, processed or packaged foods which are more sugary, salty and fatty. 70 to 75% of food eaten should be vegetables and fruit. Three meals at set times with snacks allowed between meals, but they must be fruit or vegetables. Aim to eat over a 12 hour period , example 7 am to 7 pm, and STOP after  your last meal of the day. Drink water,generally about 64 ounces per day, no other drink is as healthy. Fruit juice is best enjoyed in a healthy way, by EATING the fruit.  Thanks for choosing [facility] we consider it a privelige to serve you.

## 2023-09-09 NOTE — Assessment & Plan Note (Addendum)
BP Readings from Last 3 Encounters:  09/09/23 134/74  08/11/23 132/84  02/11/23 133/73  Currently well-controlled without medication Continue to monitor blood pressure at home and reports blood pressure readings greater than 140/90 DASH diet advised, continue to intensify efforts at losing weight

## 2023-09-11 ENCOUNTER — Other Ambulatory Visit (HOSPITAL_COMMUNITY): Payer: Self-pay

## 2023-09-12 ENCOUNTER — Other Ambulatory Visit: Payer: Self-pay | Admitting: Nurse Practitioner

## 2023-09-12 ENCOUNTER — Other Ambulatory Visit (HOSPITAL_COMMUNITY): Payer: Self-pay

## 2023-09-12 MED ORDER — SEMAGLUTIDE-WEIGHT MANAGEMENT 0.5 MG/0.5ML ~~LOC~~ SOAJ: 0.5000 mg | SUBCUTANEOUS | 0 refills | Status: AC

## 2023-09-12 NOTE — Telephone Encounter (Signed)
It appears you have signed an order for this medication several days this week. Please sign if appropriate.

## 2023-09-15 ENCOUNTER — Other Ambulatory Visit (HOSPITAL_COMMUNITY): Payer: Self-pay

## 2023-09-18 ENCOUNTER — Encounter: Payer: Medicaid Other | Admitting: Nurse Practitioner

## 2023-09-30 ENCOUNTER — Ambulatory Visit (HOSPITAL_BASED_OUTPATIENT_CLINIC_OR_DEPARTMENT_OTHER): Payer: Medicaid Other | Attending: Nurse Practitioner | Admitting: Internal Medicine

## 2023-09-30 DIAGNOSIS — G4733 Obstructive sleep apnea (adult) (pediatric): Secondary | ICD-10-CM | POA: Insufficient documentation

## 2023-09-30 DIAGNOSIS — G4700 Insomnia, unspecified: Secondary | ICD-10-CM

## 2023-09-30 DIAGNOSIS — R0683 Snoring: Secondary | ICD-10-CM

## 2023-09-30 DIAGNOSIS — G4736 Sleep related hypoventilation in conditions classified elsewhere: Secondary | ICD-10-CM | POA: Insufficient documentation

## 2023-10-04 DIAGNOSIS — G4700 Insomnia, unspecified: Secondary | ICD-10-CM | POA: Diagnosis not present

## 2023-10-04 NOTE — Procedures (Signed)
   Patient Name: Dana Burnett, Devora Date: 10/01/2023 Gender: Female D.O.B: Apr 25, 1991 Age (years): 32 Referring Provider: Donell Beers FNP Height (inches): 61 Interpreting Physician: Jetty Duhamel MD, ABSM Weight (lbs): 391 RPSGT: [place] Sink BMI: 74 MRN: [id number] Neck Size: <br>  CLINICAL INFORMATION Sleep Study Type: HST Indication for sleep study: Snoring Epworth Sleepiness Score: 16  SLEEP STUDY TECHNIQUE A multi-channel overnight portable sleep study was performed. The channels recorded were: nasal airflow, thoracic respiratory movement, and oxygen saturation with a pulse oximetry. Snoring was also monitored.  MEDICATIONS Patient self administered medications include: N/A.  SLEEP ARCHITECTURE Patient was studied for 364.5 minutes. The sleep efficiency was 100.0 % and the patient was supine for 0%. The arousal index was 0.0 per hour.  RESPIRATORY PARAMETERS The overall AHI was 42.5 per hour, with a central apnea index of 0 per hour. The oxygen nadir was 71% during sleep.  CARDIAC DATA Mean heart rate during sleep was 96.0 bpm.  IMPRESSIONS - Severe obstructive sleep apnea occurred during this study (AHI = 42.5/h). - Oxygen desaturation was noted during this study (Min O2 = 71%, Mean 97%). - Patient snored.  DIAGNOSIS - Obstructive Sleep Apnea (G47.33) - Nocturnal Hypoxemia (G47.36)  RECOMMENDATIONS - Suggest CPAP titration sleep study or autopap. Other options would be based obn clinical judgment. - Be careful with alcohol, sedatives and other CNS depressants that may worsen sleep apnea and disrupt normal sleep architecture. - Sleep hygiene should be reviewed to assess factors that may improve sleep quality. - Weight management and regular exercise should be initiated or continued.  [Electronically signed] 10/04/2023 11:24 AM  Jetty Duhamel MD, ABSM Diplomate, American Board of Sleep Medicine NPI: [id number]                           Jetty Duhamel Diplomate, American Board of Sleep Medicine  ELECTRONICALLY SIGNED ON:  10/04/2023, 11:21 AM [facility] PH: [phone]   FX: [phone] ACCREDITED BY THE AMERICAN ACADEMY OF SLEEP MEDICINE

## 2023-10-13 ENCOUNTER — Encounter: Payer: Self-pay | Admitting: Nurse Practitioner

## 2023-10-13 ENCOUNTER — Ambulatory Visit (INDEPENDENT_AMBULATORY_CARE_PROVIDER_SITE_OTHER): Payer: Medicaid Other | Admitting: Nurse Practitioner

## 2023-10-13 VITALS — BP 132/83 | HR 85 | Temp 98.5°F | Ht 61.0 in | Wt 383.0 lb

## 2023-10-13 DIAGNOSIS — D509 Iron deficiency anemia, unspecified: Secondary | ICD-10-CM

## 2023-10-13 DIAGNOSIS — Z6841 Body Mass Index (BMI) 40.0 and over, adult: Secondary | ICD-10-CM | POA: Diagnosis not present

## 2023-10-13 DIAGNOSIS — R7303 Prediabetes: Secondary | ICD-10-CM | POA: Diagnosis not present

## 2023-10-13 NOTE — Progress Notes (Signed)
Office: [phone]  /  Fax: [phone]   Initial Visit  Dana Burnett was seen in clinic today to evaluate for obesity. She is interested in losing weight to improve overall health and reduce the risk of weight related complications. She presents today to review program treatment options, initial physical assessment, and evaluation.     She was referred by: PCP  When asked what else they would like to accomplish? She states: Adopt healthier eating patterns, Improve quality of life, and Lose a target amount of weight : 200 lbs in 24 months.  Weight history:  She started gaining weight as a child.  She has always struggled with her weight.   When asked how has your weight affected you? She states: Contributed to orthopedic problems or mobility issues, Having fatigue, and Having poor endurance  Some associated conditions: HTN, insomnia, iron def anemia  Contributing factors: Family history of obesity and after 3 deaths in her family.    Weight promoting medications identified: None  Current nutrition plan: None  Current level of physical activity: None  Current or previous pharmacotherapy: currently taking Wegovy 0.5mg  and is increasing this week to 1mg .  Denies side effects.   Response to medication: Denies side effects.  Has lost 18 lbs since starting Wegovy.    Past medical history includes:   Past Medical History:  Diagnosis Date   Allergy    Anemia    Anxiety    Depression    Hypertension    Sleep apnea      Objective:   BP 132/83   Pulse 85   Temp 98.5 F (36.9 C)   Ht 5\' 1"  (1.549 m)   Wt (!) 383 lb (173.7 kg)   LMP 10/06/2023 (Approximate)   SpO2 97%   BMI 72.37 kg/m  She was weighed on the bioimpedance scale: Body mass index is 72.37 kg/m.  Peak Weight:410 lbs , Body Fat%:62.6%, Visceral Fat Rating:30, Weight trend over the last 12 months: Decreasing  General:  Alert, oriented and cooperative. Patient is in no acute distress.  Respiratory:  Normal respiratory effort, no problems with respiration noted   Gait: able to ambulate independently  Mental Status: Normal mood and affect. Normal behavior. Normal judgment and thought content.   DIAGNOSTIC DATA REVIEWED:  BMET    Component Value Date/Time   NA 137 08/11/2023 0909   K 4.6 08/11/2023 0909   CL 99 08/11/2023 0909   CO2 25 08/11/2023 0909   GLUCOSE 89 08/11/2023 0909   GLUCOSE 128 (H) 11/05/2020 2222   BUN 13 08/11/2023 0909   CREATININE 0.80 08/11/2023 0909   CALCIUM 9.2 08/11/2023 0909   GFRNONAA >60 11/05/2020 2222   GFRAA >60 07/12/2017 2020   Lab Results  Component Value Date   HGBA1C 5.9 (H) 08/11/2023   HGBA1C 5.6 12/07/2021   No results found for: "INSULIN" CBC    Component Value Date/Time   WBC 7.7 08/11/2023 0909   WBC 4.4 11/05/2020 2222   RBC 5.25 08/11/2023 0909   RBC 5.73 (H) 11/05/2020 2222   HGB 8.8 (L) 08/11/2023 0909   HCT 33.7 (L) 08/11/2023 0909   PLT 354 08/11/2023 0909   MCV 64 (L) 08/11/2023 0909   MCH 16.8 (L) 08/11/2023 0909   MCH 16.4 (L) 11/05/2020 2222   MCHC 26.1 (L) 08/11/2023 0909   MCHC 25.8 (L) 11/05/2020 2222   RDW 19.3 (H) 08/11/2023 0909   Iron/TIBC/Ferritin/ %Sat    Component Value Date/Time   IRON 17 (  L) 08/11/2023 0909   TIBC 390 08/11/2023 0909   FERRITIN 5 (L) 08/11/2023 0909   IRONPCTSAT 4 (LL) 08/11/2023 0909   Lipid Panel     Component Value Date/Time   CHOL 139 08/11/2023 0909   TRIG 54 08/11/2023 0909   HDL 61 08/11/2023 0909   CHOLHDL 2.3 08/11/2023 0909   LDLCALC 66 08/11/2023 0909   Hepatic Function Panel     Component Value Date/Time   PROT 6.9 08/11/2023 0909   ALBUMIN 3.7 (L) 08/11/2023 0909   AST 17 08/11/2023 0909   ALT 13 08/11/2023 0909   ALKPHOS 72 08/11/2023 0909   BILITOT 0.2 08/11/2023 0909   BILIDIR 0.1 07/14/2017 0205   IBILI 0.6 07/14/2017 0205      Component Value Date/Time   TSH 1.600 08/11/2023 0909     Assessment and Plan:   Iron deficiency anemia,  unspecified iron deficiency anemia type Continue to follow up with PCP.  Take meds as directed  Pre-diabetes Continue to follow up with PCP.    Morbid obesity (HCC)  BMI 70 and over, adult [facility])        Obesity Treatment / Action Plan:  Patient will work on garnering support from family and friends to begin weight loss journey. Will work on eliminating or reducing the presence of highly palatable, calorie dense foods in the home. Will complete provided nutritional and psychosocial assessment questionnaire before the next appointment. Will be scheduled for indirect calorimetry to determine resting energy expenditure in a fasting state.  This will allow [place] to create a reduced calorie, high-protein meal plan to promote loss of fat mass while preserving muscle mass. Counseled on the health benefits of losing 5%-15% of total body weight. Was counseled on nutritional approaches to weight loss and benefits of reducing processed foods and consuming plant-based foods and high quality protein as part of nutritional weight management. Was counseled on pharmacotherapy and role as an adjunct in weight management.   Obesity Education Performed Today:  She was weighed on the bioimpedance scale and results were discussed and documented in the synopsis.  We discussed obesity as a disease and the importance of a more detailed evaluation of all the factors contributing to the disease.  We discussed the importance of long term lifestyle changes which include nutrition, exercise and behavioral modifications as well as the importance of customizing this to her specific health and social needs.  We discussed the benefits of reaching a healthier weight to alleviate the symptoms of existing conditions and reduce the risks of the biomechanical, metabolic and psychological effects of obesity.  Arihana A Stetzer appears to be in the action stage of change and states they are ready to start intensive lifestyle  modifications and behavioral modifications.  30 minutes was spent today on this visit including the above counseling, pre-visit chart review, and post-visit documentation.  Reviewed by clinician on day of visit: allergies, medications, problem list, medical history, surgical history, family history, social history, and previous encounter notes pertinent to obesity diagnosis.    Theodis Sato Elbony Mcclimans FNP-C

## 2023-10-28 LAB — IRON,TIBC AND FERRITIN PANEL
Ferritin: 5 ng/mL — ABNORMAL LOW (ref 15–150)
Iron Saturation: 4 % — CL (ref 15–55)
Iron: 17 ug/dL — ABNORMAL LOW (ref 27–159)
Total Iron Binding Capacity: 390 ug/dL (ref 250–450)
UIBC: 373 ug/dL (ref 131–425)

## 2023-10-28 LAB — SPECIMEN STATUS REPORT

## 2023-12-10 ENCOUNTER — Other Ambulatory Visit (HOSPITAL_COMMUNITY): Payer: Self-pay

## 2023-12-10 ENCOUNTER — Other Ambulatory Visit: Payer: Self-pay | Admitting: Nurse Practitioner

## 2023-12-10 DIAGNOSIS — N946 Dysmenorrhea, unspecified: Secondary | ICD-10-CM

## 2023-12-10 MED ORDER — IBUPROFEN 600 MG PO TABS
600.0000 mg | ORAL_TABLET | Freq: Three times a day (TID) | ORAL | 0 refills | Status: AC | PRN
Start: 2023-12-10 — End: ?
  Filled 2023-12-10: qty 30, 10d supply, fill #0

## 2023-12-17 ENCOUNTER — Encounter: Payer: Self-pay | Admitting: Nurse Practitioner

## 2023-12-17 ENCOUNTER — Ambulatory Visit (INDEPENDENT_AMBULATORY_CARE_PROVIDER_SITE_OTHER): Payer: Medicaid Other | Admitting: Nurse Practitioner

## 2023-12-17 ENCOUNTER — Other Ambulatory Visit (HOSPITAL_COMMUNITY): Payer: Self-pay

## 2023-12-17 VITALS — BP 144/88 | HR 100 | Temp 97.1°F | Ht 61.0 in | Wt 380.0 lb

## 2023-12-17 DIAGNOSIS — Z6841 Body Mass Index (BMI) 40.0 and over, adult: Secondary | ICD-10-CM | POA: Diagnosis not present

## 2023-12-17 DIAGNOSIS — D509 Iron deficiency anemia, unspecified: Secondary | ICD-10-CM | POA: Diagnosis not present

## 2023-12-17 DIAGNOSIS — I10 Essential (primary) hypertension: Secondary | ICD-10-CM

## 2023-12-17 MED ORDER — LOSARTAN POTASSIUM 25 MG PO TABS
25.0000 mg | ORAL_TABLET | Freq: Every day | ORAL | 1 refills | Status: DC
  Filled 2023-12-17 – 2024-01-15 (×2): qty 90, 90d supply, fill #0
  Filled 2024-03-07: qty 90, 90d supply, fill #1

## 2023-12-17 MED ORDER — SEMAGLUTIDE-WEIGHT MANAGEMENT 2.4 MG/0.75ML ~~LOC~~ SOAJ
2.4000 mg | SUBCUTANEOUS | 3 refills | Status: DC
Start: 2023-12-17 — End: 2024-01-12
  Filled 2023-12-17 – 2024-01-07 (×2): qty 3, 28d supply, fill #0

## 2023-12-17 NOTE — Assessment & Plan Note (Addendum)
 Wt Readings from Last 3 Encounters:  12/17/23 (!) 380 lb (172.4 kg)  10/13/23 (!) 383 lb (173.7 kg)  09/30/23 (!) 391 lb (177.4 kg)  Body mass index is 71.8 kg/m.  Has lost 3 lbs since her last visit, eating once or twcie daily, states that her diet can be better.  She is due for follow-up with the specialist at the medical weight management clinic, stated that she has not followed up due to co-pay She does walking exercises 20 to 30 minutes 4 days a week Currently on Wegovy  1.7 mg once weekly, only has mild nausea with the medication Will increase Wegovy  to 2.4 mg once weekly Patient counseled on low-carb diet, encouraged to avoid skipping meals but eat 3 healthy meals consisting mainly vegetables and protein 3 times daily  encouraged to follow-up with the provider at the weight management clinic as planned Encouraged to engage in regular moderate to vigorous exercise at least 150 minutes weekly as tolerated discussed Follow-up in 3 months

## 2023-12-17 NOTE — Progress Notes (Signed)
 Established Patient Office Visit  Subjective:  Patient ID: Dana Burnett, female    DOB: 1991-04-08  Age: 33 y.o. MRN: [id number]  CC:  Chief Complaint  Patient presents with   Obesity    HPI Dana Burnett is a 33 y.o. female  has a past medical history of Allergy, Anemia, Anxiety, Depression, Hypertension, and Sleep apnea. Patient presents for follow up for her chronic medical conditions. Please see assessment and plan section for full HPI     Past Medical History:  Diagnosis Date   Allergy    Anemia    Anxiety    Depression    Hypertension    Sleep apnea     Past Surgical History:  Procedure Laterality Date   CHOLECYSTECTOMY N/A 07/14/2017   Procedure: LAPAROSCOPIC CHOLECYSTECTOMY WITH INTRAOPERATIVE CHOLANGIOGRAM;  Surgeon: Dareen Ebbing, MD;  Location: [facility] OR;  Service: General;  Laterality: N/A;   TOOTH EXTRACTION      Family History  Problem Relation Age of Onset   HIV/AIDS Mother    Drug abuse Father    Stomach cancer Paternal Aunt    Diabetes Maternal Grandfather    Hypertension Maternal Grandfather    Cancer Paternal Grandmother    Diabetes Paternal Grandmother    Hypertension Paternal Grandmother    Lung cancer Paternal Grandmother    Cancer Other     Social History   Socioeconomic History   Marital status: Single    Spouse name: Not on file   Number of children: Not on file   Years of education: Not on file   Highest education level: 12th grade  Occupational History   Not on file  Tobacco Use   Smoking status: Never   Smokeless tobacco: Never  Vaping Use   Vaping status: Never Used  Substance and Sexual Activity   Alcohol use: Yes    Comment: occ   Drug use: No   Sexual activity: Yes    Birth control/protection: Implant  Other Topics Concern   Not on file  Social History Narrative   Lives with her partner    Social Drivers of Health   Financial Resource Strain: Patient Declined (12/16/2023)   Overall Financial Resource  Strain (CARDIA)    Difficulty of Paying Living Expenses: Patient declined  Food Insecurity: Patient Declined (12/16/2023)   Hunger Vital Sign    Worried About Running Out of Food in the Last Year: Patient declined    Ran Out of Food in the Last Year: Patient declined  Transportation Needs: No Transportation Needs (12/16/2023)   PRAPARE - Administrator, Civil Service (Medical): No    Lack of Transportation (Non-Medical): No  Physical Activity: Unknown (12/16/2023)   Exercise Vital Sign    Days of Exercise per Week: 0 days    Minutes of Exercise per Session: Not on file  Stress: Patient Declined (12/16/2023)   [employer] of Occupational Health - Occupational Stress Questionnaire    Feeling of Stress : Patient declined  Social Connections: Unknown (12/16/2023)   Social Connection and Isolation Panel [NHANES]    Frequency of Communication with Friends and Family: Patient declined    Frequency of Social Gatherings with Friends and Family: Patient declined    Attends Religious Services: Never    Database administrator or Organizations: No    Attends Engineer, structural: Not on file    Marital Status: Living with partner  Intimate Partner Violence: Not on file    Outpatient Medications  Prior to Visit  Medication Sig Dispense Refill   ibuprofen  (ADVIL ) 600 MG tablet Take 1 tablet (600 mg total) by mouth every 8 (eight) hours as needed. 30 tablet 0   amoxicillin (AMOXIL) 500 MG capsule Take by mouth. (Patient not taking: Reported on 12/17/2023)     carbamide peroxide (DEBROX) 6.5 % OTIC solution Place 5 drops into both ears 2 (two) times daily. (Patient not taking: Reported on 12/17/2023) 15 mL 0   diazepam (VALIUM) 10 MG tablet Take 10 mg by mouth daily as needed. (Patient not taking: Reported on 12/17/2023)     ferrous sulfate  325 (65 FE) MG tablet Take 1 tablet (325 mg total) by mouth daily before breakfast. (Patient not taking: Reported on 12/17/2023) 90 tablet 1    Semaglutide -Weight Management 1.7 MG/0.75ML SOAJ Inject 1.7 mg into the skin once a week for 28 days. (Patient not taking: Reported on 12/17/2023) 3 mL 0   No facility-administered medications prior to visit.    Allergies  Allergen Reactions   Shellfish Allergy Anaphylaxis    ROS Review of Systems  Constitutional:  Negative for appetite change, chills, fatigue and fever.  HENT:  Negative for congestion, postnasal drip, rhinorrhea and sneezing.   Respiratory:  Negative for cough, shortness of breath and wheezing.   Cardiovascular:  Negative for chest pain, palpitations and leg swelling.  Gastrointestinal:  Negative for abdominal pain, constipation, nausea and vomiting.  Genitourinary:  Negative for difficulty urinating, dysuria, flank pain and frequency.  Musculoskeletal:  Negative for arthralgias, back pain, joint swelling and myalgias.  Skin:  Negative for color change, pallor, rash and wound.  Neurological:  Negative for dizziness, facial asymmetry, weakness, numbness and headaches.  Psychiatric/Behavioral:  Negative for behavioral problems, confusion, self-injury and suicidal ideas.       Objective:    Physical Exam Vitals and nursing note reviewed.  Constitutional:      General: She is not in acute distress.    Appearance: Normal appearance. She is obese. She is not ill-appearing, toxic-appearing or diaphoretic.  HENT:     Mouth/Throat:     Mouth: Mucous membranes are moist.     Pharynx: Oropharynx is clear. No oropharyngeal exudate or posterior oropharyngeal erythema.  Eyes:     General: No scleral icterus.       Right eye: No discharge.        Left eye: No discharge.     Extraocular Movements: Extraocular movements intact.     Conjunctiva/sclera: Conjunctivae normal.  Cardiovascular:     Rate and Rhythm: Normal rate and regular rhythm.     Pulses: Normal pulses.     Heart sounds: Normal heart sounds. No murmur heard.    No friction rub. No gallop.  Pulmonary:      Effort: Pulmonary effort is normal. No respiratory distress.     Breath sounds: Normal breath sounds. No stridor. No wheezing, rhonchi or rales.  Chest:     Chest wall: No tenderness.  Abdominal:     General: There is no distension.     Palpations: Abdomen is soft.     Tenderness: There is no abdominal tenderness. There is no right CVA tenderness, left CVA tenderness or guarding.  Musculoskeletal:        General: No swelling, tenderness, deformity or signs of injury.     Right lower leg: No edema.     Left lower leg: No edema.  Skin:    General: Skin is warm and dry.  Capillary Refill: Capillary refill takes less than 2 seconds.     Coloration: Skin is not jaundiced or pale.     Findings: No bruising, erythema or lesion.  Neurological:     Mental Status: She is alert and oriented to person, place, and time.     Motor: No weakness.     Coordination: Coordination normal.     Gait: Gait normal.  Psychiatric:        Mood and Affect: Mood normal.        Behavior: Behavior normal.        Thought Content: Thought content normal.        Judgment: Judgment normal.     BP (!) 144/88   Pulse 100   Temp (!) 97.1 F (36.2 C)   Ht 5\' 1"  (1.549 m)   Wt (!) 380 lb (172.4 kg)   SpO2 100%   BMI 71.80 kg/m  Wt Readings from Last 3 Encounters:  12/17/23 (!) 380 lb (172.4 kg)  10/13/23 (!) 383 lb (173.7 kg)  09/30/23 (!) 391 lb (177.4 kg)    Lab Results  Component Value Date   TSH 1.600 08/11/2023   Lab Results  Component Value Date   WBC 7.7 08/11/2023   HGB 8.8 (L) 08/11/2023   HCT 33.7 (L) 08/11/2023   MCV 64 (L) 08/11/2023   PLT 354 08/11/2023   Lab Results  Component Value Date   NA 137 08/11/2023   K 4.6 08/11/2023   CO2 25 08/11/2023   GLUCOSE 89 08/11/2023   BUN 13 08/11/2023   CREATININE 0.80 08/11/2023   BILITOT 0.2 08/11/2023   ALKPHOS 72 08/11/2023   AST 17 08/11/2023   ALT 13 08/11/2023   PROT 6.9 08/11/2023   ALBUMIN 3.7 (L) 08/11/2023   CALCIUM 9.2  08/11/2023   ANIONGAP 9 11/05/2020   EGFR 102 08/11/2023   Lab Results  Component Value Date   CHOL 139 08/11/2023   Lab Results  Component Value Date   HDL 61 08/11/2023   Lab Results  Component Value Date   LDLCALC 66 08/11/2023   Lab Results  Component Value Date   TRIG 54 08/11/2023   Lab Results  Component Value Date   CHOLHDL 2.3 08/11/2023   Lab Results  Component Value Date   HGBA1C 5.9 (H) 08/11/2023      Assessment & Plan:   Problem List Items Addressed This Visit       Cardiovascular and Mediastinum   Hypertension   Blood pressure is elevated in the office today  will start losartan  25 mg daily Blood pressure goal is less than 140/90 Encouraged to monitor blood pressure at home keep a log Follow-up with a nurse in 4 weeks for BP check DASH diet and commitment to daily physical activity for a minimum of 30 minutes discussed and encouraged, as a part of hypertension management. The importance of attaining a healthy weight is also discussed. BMP today     12/17/2023    2:05 PM 12/17/2023    1:30 PM 12/17/2023    1:23 PM 10/13/2023    8:00 AM 09/30/2023    9:16 AM 09/09/2023    1:28 PM 08/11/2023    8:17 AM  BP/Weight  Systolic BP 144 130 131 132  134 132  Diastolic BP 88 91 91 83  74 84  Wt. (Lbs)   380 383 391 390 401  BMI   71.8 kg/m2 72.37 kg/m2 73.88 kg/m2 73.69 kg/m2 75.77 kg/m2  Relevant Medications   losartan  (COZAAR ) 25 MG tablet   Other Relevant Orders   Recheck vitals     Other   Morbid obesity with BMI of 70 and over, adult (HCC) - Primary   Wt Readings from Last 3 Encounters:  12/17/23 (!) 380 lb (172.4 kg)  10/13/23 (!) 383 lb (173.7 kg)  09/30/23 (!) 391 lb (177.4 kg)  Body mass index is 71.8 kg/m.  Has lost 3 lbs since her last visit, eating once or twcie daily, states that her diet can be better.  She is due for follow-up with the specialist at the medical weight management clinic, stated that she has not  followed up due to co-pay She does walking exercises 20 to 30 minutes 4 days a week Currently on Wegovy  1.7 mg once weekly, only has mild nausea with the medication Will increase Wegovy  to 2.4 mg once weekly Patient counseled on low-carb diet, encouraged to avoid skipping meals but eat 3 healthy meals consisting mainly vegetables and protein 3 times daily  encouraged to follow-up with the provider at the weight management clinic as planned Encouraged to engage in regular moderate to vigorous exercise at least 150 minutes weekly as tolerated discussed Follow-up in 3 months       Relevant Medications   Semaglutide -Weight Management 2.4 MG/0.75ML SOAJ   Other Relevant Orders   Basic Metabolic Panel   Iron deficiency anemia   Lab Results  Component Value Date   WBC 7.7 08/11/2023   HGB 8.8 (L) 08/11/2023   HCT 33.7 (L) 08/11/2023   MCV 64 (L) 08/11/2023   PLT 354 08/11/2023  She has not been taking ferrous sulfate  She would prefer to iron infusion if her iron and blood counts remains low Rechecking CBC and iron panel today      Relevant Orders   CBC   Iron, TIBC and Ferritin Panel    Meds ordered this encounter  Medications   Semaglutide -Weight Management 2.4 MG/0.75ML SOAJ    Sig: Inject 2.4 mg into the skin once a week.    Dispense:  3 mL    Refill:  3   losartan  (COZAAR ) 25 MG tablet    Sig: Take 1 tablet (25 mg total) by mouth daily.    Dispense:  90 tablet    Refill:  1    Follow-up: Return in about 3 months (around 03/16/2024) for HTN obesity,.    Kolten Ryback R Erion Weightman, FNP

## 2023-12-17 NOTE — Assessment & Plan Note (Signed)
 Blood pressure is elevated in the office today  will start losartan  25 mg daily Blood pressure goal is less than 140/90 Encouraged to monitor blood pressure at home keep a log Follow-up with a nurse in 4 weeks for BP check DASH diet and commitment to daily physical activity for a minimum of 30 minutes discussed and encouraged, as a part of hypertension management. The importance of attaining a healthy weight is also discussed. BMP today     12/17/2023    2:05 PM 12/17/2023    1:30 PM 12/17/2023    1:23 PM 10/13/2023    8:00 AM 09/30/2023    9:16 AM 09/09/2023    1:28 PM 08/11/2023    8:17 AM  BP/Weight  Systolic BP 144 130 131 132  134 132  Diastolic BP 88 91 91 83  74 84  Wt. (Lbs)   380 383 391 390 401  BMI   71.8 kg/m2 72.37 kg/m2 73.88 kg/m2 73.69 kg/m2 75.77 kg/m2

## 2023-12-17 NOTE — Assessment & Plan Note (Signed)
 Lab Results  Component Value Date   WBC 7.7 08/11/2023   HGB 8.8 (L) 08/11/2023   HCT 33.7 (L) 08/11/2023   MCV 64 (L) 08/11/2023   PLT 354 08/11/2023  She has not been taking ferrous sulfate  She would prefer to iron infusion if her iron and blood counts remains low Rechecking CBC and iron panel today

## 2023-12-17 NOTE — Patient Instructions (Addendum)
 Around 3 times per week, check your blood pressure 2 times per day. once in the morning and once in the evening. The readings should be at least one minute apart. Write down these values and bring them to your next nurse visit/appointment.  When you check your BP, make sure you have been doing something calm/relaxing 5 minutes prior to checking. Both feet should be flat on the floor and you should be sitting. Use your left arm and make sure it is in a relaxed position (on a table), and that the cuff is at the approximate level/height of your heart.   Blood pressure goal is less than 140/90    Iron deficiency anemia, unspecified iron deficiency anemia type  - CBC - Iron, TIBC and Ferritin Panel  . Primary hypertension  - Recheck vitals - losartan  (COZAAR ) 25 MG tablet; Take 1 tablet (25 mg total) by mouth daily.  Dispense: 90 tablet; Refill: 1     It is important that you exercise regularly at least 30 minutes 5 times a week as tolerated  Think about what you will eat, plan ahead. Choose " clean, green, fresh or frozen" over canned, processed or packaged foods which are more sugary, salty and fatty. 70 to 75% of food eaten should be vegetables and fruit. Three meals at set times with snacks allowed between meals, but they must be fruit or vegetables. Aim to eat over a 12 hour period , example 7 am to 7 pm, and STOP after  your last meal of the day. Drink water,generally about 64 ounces per day, no other drink is as healthy. Fruit juice is best enjoyed in a healthy way, by EATING the fruit.  Thanks for choosing [facility] we consider it a privelige to serve you.

## 2023-12-18 LAB — IRON,TIBC AND FERRITIN PANEL
Ferritin: 11 ng/mL — ABNORMAL LOW (ref 15–150)
Iron Saturation: 5 % — CL (ref 15–55)
Iron: 17 ug/dL — ABNORMAL LOW (ref 27–159)
Total Iron Binding Capacity: 364 ug/dL (ref 250–450)
UIBC: 347 ug/dL (ref 131–425)

## 2023-12-18 LAB — CBC
Hematocrit: 36.5 % (ref 34.0–46.6)
Hemoglobin: 9.7 g/dL — ABNORMAL LOW (ref 11.1–15.9)
MCH: 17.4 pg — ABNORMAL LOW (ref 26.6–33.0)
MCHC: 26.6 g/dL — ABNORMAL LOW (ref 31.5–35.7)
MCV: 65 fL — ABNORMAL LOW (ref 79–97)
Platelets: 359 10*3/uL (ref 150–450)
RBC: 5.59 x10E6/uL — ABNORMAL HIGH (ref 3.77–5.28)
RDW: 19.9 % — ABNORMAL HIGH (ref 11.7–15.4)
WBC: 8.7 10*3/uL (ref 3.4–10.8)

## 2023-12-18 LAB — BASIC METABOLIC PANEL
BUN/Creatinine Ratio: 13 (ref 9–23)
BUN: 11 mg/dL (ref 6–20)
CO2: 26 mmol/L (ref 20–29)
Calcium: 9.3 mg/dL (ref 8.7–10.2)
Chloride: 100 mmol/L (ref 96–106)
Creatinine, Ser: 0.82 mg/dL (ref 0.57–1.00)
Glucose: 80 mg/dL (ref 70–99)
Potassium: 4.3 mmol/L (ref 3.5–5.2)
Sodium: 139 mmol/L (ref 134–144)
eGFR: 99 mL/min/{1.73_m2} (ref >59)

## 2023-12-19 ENCOUNTER — Other Ambulatory Visit: Payer: Self-pay | Admitting: Nurse Practitioner

## 2023-12-19 DIAGNOSIS — D509 Iron deficiency anemia, unspecified: Secondary | ICD-10-CM

## 2023-12-29 ENCOUNTER — Other Ambulatory Visit (HOSPITAL_COMMUNITY): Payer: Self-pay

## 2024-01-07 ENCOUNTER — Other Ambulatory Visit (HOSPITAL_COMMUNITY): Payer: Self-pay

## 2024-01-07 ENCOUNTER — Telehealth: Payer: Self-pay

## 2024-01-07 ENCOUNTER — Inpatient Hospital Stay: Payer: Medicaid Other | Attending: Hematology and Oncology | Admitting: Hematology and Oncology

## 2024-01-07 NOTE — Telephone Encounter (Signed)
 Spoke with patient.. she called spoke with a guy and they transferred her to a voicemail and she left a message.  She asked to be rescheduled.  I sent message to Dr Loretha for her to reschedule her. I advised patient someone would call her with the reschedule time and date.  Patient confirmed and understood.

## 2024-01-11 ENCOUNTER — Other Ambulatory Visit: Payer: Self-pay | Admitting: Nurse Practitioner

## 2024-01-12 ENCOUNTER — Other Ambulatory Visit: Payer: Self-pay | Admitting: Nurse Practitioner

## 2024-01-12 MED ORDER — SEMAGLUTIDE-WEIGHT MANAGEMENT 2.4 MG/0.75ML ~~LOC~~ SOAJ
2.4000 mg | SUBCUTANEOUS | 3 refills | Status: AC
  Filled 2024-01-15: qty 3, 28d supply, fill #0
  Filled 2024-02-08: qty 3, 28d supply, fill #1
  Filled 2024-02-23 – 2024-04-13 (×6): qty 3, 28d supply, fill #2

## 2024-01-12 MED ORDER — SEMAGLUTIDE-WEIGHT MANAGEMENT 2.4 MG/0.75ML ~~LOC~~ SOAJ: 2.4000 mg | SUBCUTANEOUS | 3 refills | Status: DC

## 2024-01-13 ENCOUNTER — Other Ambulatory Visit (HOSPITAL_COMMUNITY): Payer: Self-pay

## 2024-01-15 ENCOUNTER — Ambulatory Visit: Payer: Medicaid Other

## 2024-01-15 ENCOUNTER — Other Ambulatory Visit (HOSPITAL_COMMUNITY): Payer: Self-pay

## 2024-01-16 ENCOUNTER — Ambulatory Visit: Payer: Medicaid Other

## 2024-01-16 VITALS — BP 122/89 | HR 105 | Temp 97.6°F | Wt 374.0 lb

## 2024-01-16 DIAGNOSIS — I10 Essential (primary) hypertension: Secondary | ICD-10-CM

## 2024-01-16 NOTE — Progress Notes (Signed)
   Blood Pressure Recheck Visit  Name: Dana Burnett MRN: [id number] Date of Birth: October 25, 1991  Dana Burnett presents today for Blood Pressure recheck with clinical support staff.  Order for BP recheck by Brendolyn Patty FNP, ordered on 12/17/2023.   BP Readings from Last 3 Encounters:  01/16/24 122/89  12/17/23 (!) 144/88  10/13/23 132/83    Current Outpatient Medications  Medication Sig Dispense Refill   ibuprofen (ADVIL) 600 MG tablet Take 1 tablet (600 mg total) by mouth every 8 (eight) hours as needed. 30 tablet 0   losartan (COZAAR) 25 MG tablet Take 1 tablet (25 mg total) by mouth daily. 90 tablet 1   Semaglutide-Weight Management 2.4 MG/0.75ML SOAJ Inject 2.4 mg into the skin once a week. 3 mL 3   carbamide peroxide (DEBROX) 6.5 % OTIC solution Place 5 drops into both ears 2 (two) times daily. (Patient not taking: Reported on 10/13/2023) 15 mL 0   diazepam (VALIUM) 10 MG tablet Take 10 mg by mouth daily as needed. (Patient not taking: Reported on 01/16/2024)     ferrous sulfate 325 (65 FE) MG tablet Take 1 tablet (325 mg total) by mouth daily before breakfast. (Patient not taking: Reported on 01/16/2024) 90 tablet 1   No current facility-administered medications for this visit.    Hypertensive Medication Review: Patient states that they are not taking all their hypertensive medications as prescribed and their last dose of hypertensive medications was not known   Documentation of any medication adherence discrepancies: none pt will pick up today.  Provider Recommendation:  Spoke to Edwin Dada, FNP and they stated: Continue the medication and return next week for labs.  Patient has been scheduled to follow up with Next week for labs.   Patient has been given provider's recommendations and does not have any questions or concerns at this time. Patient will contact the office for any future questions or concerns.   Renelda Loma RMA

## 2024-01-21 ENCOUNTER — Other Ambulatory Visit: Payer: Self-pay

## 2024-01-26 ENCOUNTER — Other Ambulatory Visit: Payer: Medicaid Other

## 2024-01-26 DIAGNOSIS — I10 Essential (primary) hypertension: Secondary | ICD-10-CM

## 2024-01-27 LAB — BASIC METABOLIC PANEL
BUN/Creatinine Ratio: 11 (ref 9–23)
BUN: 9 mg/dL (ref 6–20)
CO2: 25 mmol/L (ref 20–29)
Calcium: 9.2 mg/dL (ref 8.7–10.2)
Chloride: 99 mmol/L (ref 96–106)
Creatinine, Ser: 0.85 mg/dL (ref 0.57–1.00)
Glucose: 80 mg/dL (ref 70–99)
Potassium: 4.8 mmol/L (ref 3.5–5.2)
Sodium: 136 mmol/L (ref 134–144)
eGFR: 94 mL/min/{1.73_m2} (ref >59)

## 2024-02-09 ENCOUNTER — Other Ambulatory Visit (HOSPITAL_COMMUNITY): Payer: Self-pay

## 2024-02-24 ENCOUNTER — Other Ambulatory Visit (HOSPITAL_COMMUNITY): Payer: Self-pay

## 2024-02-26 ENCOUNTER — Other Ambulatory Visit (HOSPITAL_COMMUNITY): Payer: Self-pay

## 2024-03-01 ENCOUNTER — Other Ambulatory Visit (HOSPITAL_COMMUNITY): Payer: Self-pay

## 2024-03-01 ENCOUNTER — Other Ambulatory Visit: Payer: Self-pay

## 2024-03-08 ENCOUNTER — Other Ambulatory Visit: Payer: Self-pay

## 2024-03-08 ENCOUNTER — Other Ambulatory Visit (HOSPITAL_COMMUNITY): Payer: Self-pay

## 2024-03-08 ENCOUNTER — Encounter (HOSPITAL_COMMUNITY): Payer: Self-pay

## 2024-03-16 ENCOUNTER — Ambulatory Visit: Payer: Self-pay | Admitting: Nurse Practitioner

## 2024-03-16 ENCOUNTER — Other Ambulatory Visit: Payer: Self-pay

## 2024-03-16 ENCOUNTER — Encounter: Payer: Self-pay | Admitting: Nurse Practitioner

## 2024-03-16 ENCOUNTER — Other Ambulatory Visit (HOSPITAL_COMMUNITY): Payer: Self-pay

## 2024-03-16 VITALS — BP 118/77 | HR 97 | Temp 97.3°F | Wt 377.0 lb

## 2024-03-16 DIAGNOSIS — D508 Other iron deficiency anemias: Secondary | ICD-10-CM

## 2024-03-16 DIAGNOSIS — F32A Depression, unspecified: Secondary | ICD-10-CM | POA: Insufficient documentation

## 2024-03-16 DIAGNOSIS — I10 Essential (primary) hypertension: Secondary | ICD-10-CM

## 2024-03-16 DIAGNOSIS — F419 Anxiety disorder, unspecified: Secondary | ICD-10-CM

## 2024-03-16 DIAGNOSIS — Z6841 Body Mass Index (BMI) 40.0 and over, adult: Secondary | ICD-10-CM

## 2024-03-16 DIAGNOSIS — N920 Excessive and frequent menstruation with regular cycle: Secondary | ICD-10-CM | POA: Insufficient documentation

## 2024-03-16 MED ORDER — FLUOXETINE HCL 10 MG PO CAPS
10.0000 mg | ORAL_CAPSULE | Freq: Every day | ORAL | 0 refills | Status: AC
  Filled 2024-03-16: qty 60, 60d supply, fill #0

## 2024-03-16 MED ORDER — FERROUS SULFATE 325 (65 FE) MG PO TABS
325.0000 mg | ORAL_TABLET | Freq: Every day | ORAL | 1 refills | Status: AC
  Filled 2024-03-16: qty 81, 81d supply, fill #0
  Filled 2024-03-16: qty 9, 9d supply, fill #0
  Filled 2024-03-16: qty 90, 90d supply, fill #0
  Filled 2024-06-18: qty 90, 90d supply, fill #1

## 2024-03-16 MED ORDER — LOSARTAN POTASSIUM 25 MG PO TABS
25.0000 mg | ORAL_TABLET | Freq: Every day | ORAL | 1 refills | Status: AC
  Filled 2024-03-16 – 2024-06-18 (×3): qty 90, 90d supply, fill #0

## 2024-03-16 NOTE — Assessment & Plan Note (Addendum)
    03/16/2024    8:51 AM 01/16/2024   10:32 AM 12/17/2023    1:27 PM  GAD 7 : Generalized Anxiety Score  Nervous, Anxious, on Edge 1 1 0  Control/stop worrying 2 3 0  Worry too much - different things 2 1 0  Trouble relaxing 1 1 0  Restless 3 0 0  Easily annoyed or irritable 2 0 0  Afraid - awful might happen 0 0 0  Total GAD 7 Score 11 6 0  Anxiety Difficulty Somewhat difficult Not difficult at all Not difficult at all        03/16/2024    8:49 AM 01/16/2024   10:32 AM 12/17/2023    1:27 PM  Depression screen PHQ 2/9  Decreased Interest 3 0 0  Down, Depressed, Hopeless 2 0 0  PHQ - 2 Score 5 0 0  Altered sleeping 1    Tired, decreased energy 1    Change in appetite 2    Feeling bad or failure about yourself  1    Trouble concentrating 1    Moving slowly or fidgety/restless 0    Suicidal thoughts 0    PHQ-9 Score 11    Difficult doing work/chores Somewhat difficult    Start Paxil 10 mg daily, side effect of medication reviewed Continue counseling Follow-up in 6 weeks

## 2024-03-16 NOTE — Assessment & Plan Note (Addendum)
 Wt Readings from Last 3 Encounters:  03/16/24 (!) 377 lb (171 kg)  01/16/24 (!) 374 lb (169.6 kg)  12/17/23 (!) 380 lb (172.4 kg)   Body mass index is 71.23 kg/m.  Weight has increased by 3 pounds since her last visit, this is most likely due to temporarily stopping Wegovy.  She has lost 17 pounds in 6 months Patient counseled on low-carb diet, encouraged to engage in regular moderate to vigorous exercises at least 150 minutes weekly as tolerated Patient referred to bariatric surgery Continue Wegovy 2.4 mg once weekly, will decrease to a lower dose if she experiences severe nausea and vomiting with her next dose

## 2024-03-16 NOTE — Progress Notes (Signed)
 Established Patient Office Visit  Subjective:  Patient ID: Dana Burnett, female    DOB: 1991/04/24  Age: 33 y.o. MRN: [id number]  CC:  Chief Complaint  Patient presents with   Hypertension   Obesity    HPI Dana Burnett is a 33 y.o. female  has a past medical history of Allergy, Anemia, Anxiety, Depression, Hypertension, and Sleep apnea.  Patient presents for follow-up for her chronic medical conditions  Hypertension.  Currently on losartan 25 mg daily, no complaints of chest pain, shortness of breath, edema  Iron deficiency anemia.  Ferrous sulfate 325 mg daily was ordered but she did not pick up the prescription, she was referred to hematologist for possible iron infusion but she missed the call and has not been able to reach them to schedule an appointment.  She is willing to try ferrous sulfate instead of seeing hematologist, states that she sometimes feels fatigued  Menorrhagia.  Patient reports heavy and painful menses that last 7 days, states that her menstrual cycle is irregular.  Obesity.  Goes to the gym 3 days a week, eating less fatty fried foods, less high carbohydrate foods, she is now interested in referral for weight loss surgery.  Stated that she had stopped taking Wegovy the beginning of March because of some issues she was dealing with however she restarted Wegovy 2.4 mg injection ,2 days ago.  Had  nausea and vomiting yesterday, states that she now feels better   Anxiety and depression.  Due to recently being laid off at work, and feels worried about her grandmother's health.  She has started counseling, has had 1 session so far.  She denies SI, HI    Past Medical History:  Diagnosis Date   Allergy    Anemia    Anxiety    Depression    Hypertension    Sleep apnea     Past Surgical History:  Procedure Laterality Date   CHOLECYSTECTOMY N/A 07/14/2017   Procedure: LAPAROSCOPIC CHOLECYSTECTOMY WITH INTRAOPERATIVE CHOLANGIOGRAM;  Surgeon: Manus Rudd,  MD;  Location: [facility] OR;  Service: General;  Laterality: N/A;   TOOTH EXTRACTION      Family History  Problem Relation Age of Onset   HIV/AIDS Mother    Drug abuse Father    Stomach cancer Paternal Aunt    Diabetes Maternal Grandfather    Hypertension Maternal Grandfather    Cancer Paternal Grandmother    Diabetes Paternal Grandmother    Hypertension Paternal Grandmother    Lung cancer Paternal Grandmother    Cancer Other     Social History   Socioeconomic History   Marital status: Single    Spouse name: Not on file   Number of children: Not on file   Years of education: Not on file   Highest education level: 12th grade  Occupational History   Not on file  Tobacco Use   Smoking status: Never   Smokeless tobacco: Never  Vaping Use   Vaping status: Never Used  Substance and Sexual Activity   Alcohol use: Yes    Comment: occ   Drug use: No   Sexual activity: Yes    Birth control/protection: Implant  Other Topics Concern   Not on file  Social History Narrative   Lives with her partner    Social Drivers of Health   Financial Resource Strain: Patient Declined (12/16/2023)   Overall Financial Resource Strain (CARDIA)    Difficulty of Paying Living Expenses: Patient declined  Food Insecurity: Patient  Declined (12/16/2023)   Hunger Vital Sign    Worried About Running Out of Food in the Last Year: Patient declined    Ran Out of Food in the Last Year: Patient declined  Transportation Needs: No Transportation Needs (12/16/2023)   PRAPARE - Administrator, Civil Service (Medical): No    Lack of Transportation (Non-Medical): No  Physical Activity: Unknown (12/16/2023)   Exercise Vital Sign    Days of Exercise per Week: 0 days    Minutes of Exercise per Session: Not on file  Stress: Patient Declined (12/16/2023)   [employer] of Occupational Health - Occupational Stress Questionnaire    Feeling of Stress : Patient declined  Social Connections: Unknown  (12/16/2023)   Social Connection and Isolation Panel [NHANES]    Frequency of Communication with Friends and Family: Patient declined    Frequency of Social Gatherings with Friends and Family: Patient declined    Attends Religious Services: Never    Database administrator or Organizations: No    Attends Engineer, structural: Not on file    Marital Status: Living with partner  Intimate Partner Violence: Not on file    Outpatient Medications Prior to Visit  Medication Sig Dispense Refill   ibuprofen (ADVIL) 600 MG tablet Take 1 tablet (600 mg total) by mouth every 8 (eight) hours as needed. 30 tablet 0   Semaglutide-Weight Management 2.4 MG/0.75ML SOAJ Inject 2.4 mg into the skin once a week. 3 mL 3   losartan (COZAAR) 25 MG tablet Take 1 tablet (25 mg total) by mouth daily. 90 tablet 1   diazepam (VALIUM) 10 MG tablet Take 10 mg by mouth daily as needed. (Patient not taking: Reported on 12/17/2023)     carbamide peroxide (DEBROX) 6.5 % OTIC solution Place 5 drops into both ears 2 (two) times daily. (Patient not taking: Reported on 03/16/2024) 15 mL 0   ferrous sulfate 325 (65 FE) MG tablet Take 1 tablet (325 mg total) by mouth daily before breakfast. (Patient not taking: Reported on 12/17/2023) 90 tablet 1   No facility-administered medications prior to visit.    Allergies  Allergen Reactions   Shellfish Allergy Anaphylaxis    ROS Review of Systems  Constitutional:  Negative for appetite change, chills, fatigue and fever.  HENT:  Negative for congestion, postnasal drip, rhinorrhea and sneezing.   Respiratory:  Negative for cough, shortness of breath and wheezing.   Cardiovascular:  Negative for chest pain, palpitations and leg swelling.  Gastrointestinal:  Negative for abdominal pain, constipation, nausea and vomiting.  Genitourinary:  Negative for difficulty urinating, dysuria, flank pain and frequency.  Musculoskeletal:  Negative for arthralgias, back pain, joint swelling  and myalgias.  Skin:  Negative for color change, pallor, rash and wound.  Neurological:  Negative for dizziness, facial asymmetry, weakness, numbness and headaches.  Psychiatric/Behavioral:  Negative for behavioral problems, confusion, self-injury and suicidal ideas.       Objective:    Physical Exam Vitals and nursing note reviewed.  Constitutional:      General: She is not in acute distress.    Appearance: Normal appearance. She is obese. She is not ill-appearing, toxic-appearing or diaphoretic.  HENT:     Mouth/Throat:     Mouth: Mucous membranes are moist.     Pharynx: Oropharynx is clear. No oropharyngeal exudate or posterior oropharyngeal erythema.  Eyes:     General: No scleral icterus.       Right eye: No discharge.  Left eye: No discharge.     Extraocular Movements: Extraocular movements intact.     Conjunctiva/sclera: Conjunctivae normal.  Cardiovascular:     Rate and Rhythm: Normal rate and regular rhythm.     Pulses: Normal pulses.     Heart sounds: Normal heart sounds. No murmur heard.    No friction rub. No gallop.  Pulmonary:     Effort: Pulmonary effort is normal. No respiratory distress.     Breath sounds: Normal breath sounds. No stridor. No wheezing, rhonchi or rales.  Chest:     Chest wall: No tenderness.  Abdominal:     General: There is no distension.     Palpations: Abdomen is soft.     Tenderness: There is no abdominal tenderness. There is no right CVA tenderness, left CVA tenderness or guarding.  Musculoskeletal:        General: No swelling, tenderness, deformity or signs of injury.     Right lower leg: No edema.     Left lower leg: No edema.  Skin:    General: Skin is warm and dry.     Capillary Refill: Capillary refill takes less than 2 seconds.     Coloration: Skin is not jaundiced or pale.     Findings: No bruising, erythema or lesion.  Neurological:     Mental Status: She is alert and oriented to person, place, and time.     Motor:  No weakness.     Coordination: Coordination normal.     Gait: Gait normal.  Psychiatric:        Mood and Affect: Mood normal.        Behavior: Behavior normal.        Thought Content: Thought content normal.        Judgment: Judgment normal.     BP 118/77   Pulse 97   Temp (!) 97.3 F (36.3 C)   Wt (!) 377 lb (171 kg)   SpO2 98%   BMI 71.23 kg/m  Wt Readings from Last 3 Encounters:  03/16/24 (!) 377 lb (171 kg)  01/16/24 (!) 374 lb (169.6 kg)  12/17/23 (!) 380 lb (172.4 kg)    Lab Results  Component Value Date   TSH 1.600 08/11/2023   Lab Results  Component Value Date   WBC 8.7 12/17/2023   HGB 9.7 (L) 12/17/2023   HCT 36.5 12/17/2023   MCV 65 (L) 12/17/2023   PLT 359 12/17/2023   Lab Results  Component Value Date   NA 136 01/26/2024   K 4.8 01/26/2024   CO2 25 01/26/2024   GLUCOSE 80 01/26/2024   BUN 9 01/26/2024   CREATININE 0.85 01/26/2024   BILITOT 0.2 08/11/2023   ALKPHOS 72 08/11/2023   AST 17 08/11/2023   ALT 13 08/11/2023   PROT 6.9 08/11/2023   ALBUMIN 3.7 (L) 08/11/2023   CALCIUM 9.2 01/26/2024   ANIONGAP 9 11/05/2020   EGFR 94 01/26/2024   Lab Results  Component Value Date   CHOL 139 08/11/2023   Lab Results  Component Value Date   HDL 61 08/11/2023   Lab Results  Component Value Date   LDLCALC 66 08/11/2023   Lab Results  Component Value Date   TRIG 54 08/11/2023   Lab Results  Component Value Date   CHOLHDL 2.3 08/11/2023   Lab Results  Component Value Date   HGBA1C 5.9 (H) 08/11/2023      Assessment & Plan:   Problem List Items Addressed This Visit  Cardiovascular and Mediastinum   Hypertension   Controlled on losartan 25 mg daily Continue current medication DASH diet and commitment to daily physical activity for a minimum of 30 minutes discussed and encouraged, as a part of hypertension management. The importance of attaining a healthy weight is also discussed.     03/16/2024    8:58 AM 03/16/2024     8:44 AM 01/16/2024   10:30 AM 12/17/2023    2:05 PM 12/17/2023    1:30 PM 12/17/2023    1:23 PM 10/13/2023    8:00 AM  BP/Weight  Systolic BP 118 128 122 144 130 131 132  Diastolic BP 77 85 89 88 91 91 83  Wt. (Lbs)  377 374   380 383  BMI  71.23 kg/m2 70.67 kg/m2   71.8 kg/m2 72.37 kg/m2           Relevant Medications   losartan (COZAAR) 25 MG tablet     Other   Morbid obesity with BMI of 70 and over, adult (HCC) - Primary   Wt Readings from Last 3 Encounters:  03/16/24 (!) 377 lb (171 kg)  01/16/24 (!) 374 lb (169.6 kg)  12/17/23 (!) 380 lb (172.4 kg)   Body mass index is 71.23 kg/m.  Weight has increased by 3 pounds since her last visit, this is most likely due to temporarily stopping Wegovy.  She has lost 17 pounds in 6 months Patient counseled on low-carb diet, encouraged to engage in regular moderate to vigorous exercises at least 150 minutes weekly as tolerated Patient referred to bariatric surgery Continue Wegovy 2.4 mg once weekly, will decrease to a lower dose if she experiences severe nausea and vomiting with her next dose      Relevant Orders   Amb Referral to Bariatric Surgery   Iron deficiency anemia   Encouraged to take iron with vitamin C to improve absorption - ferrous sulfate 325 (65 FE) MG tablet; Take 1 tablet (325 mg total) by mouth daily before breakfast.  Dispense: 90 tablet; Refill: 1 - CBC - Iron, TIBC and Ferritin Panel       Relevant Medications   ferrous sulfate 325 (65 FE) MG tablet   Other Relevant Orders   CBC   Iron, TIBC and Ferritin Panel   Anxiety and depression      03/16/2024    8:51 AM 01/16/2024   10:32 AM 12/17/2023    1:27 PM  GAD 7 : Generalized Anxiety Score  Nervous, Anxious, on Edge 1 1 0  Control/stop worrying 2 3 0  Worry too much - different things 2 1 0  Trouble relaxing 1 1 0  Restless 3 0 0  Easily annoyed or irritable 2 0 0  Afraid - awful might happen 0 0 0  Total GAD 7 Score 11 6 0  Anxiety Difficulty  Somewhat difficult Not difficult at all Not difficult at all        03/16/2024    8:49 AM 01/16/2024   10:32 AM 12/17/2023    1:27 PM  Depression screen PHQ 2/9  Decreased Interest 3 0 0  Down, Depressed, Hopeless 2 0 0  PHQ - 2 Score 5 0 0  Altered sleeping 1    Tired, decreased energy 1    Change in appetite 2    Feeling bad or failure about yourself  1    Trouble concentrating 1    Moving slowly or fidgety/restless 0    Suicidal thoughts 0  PHQ-9 Score 11    Difficult doing work/chores Somewhat difficult    Start Paxil 10 mg daily, side effect of medication reviewed Continue counseling Follow-up in 6 weeks      Relevant Medications   FLUoxetine (PROZAC) 10 MG capsule   Menorrhagia with regular cycle     - US  PELVIC COMPLETE WITH TRANSVAGINAL to rule out fibroids      Relevant Orders   US  PELVIC COMPLETE WITH TRANSVAGINAL    Meds ordered this encounter  Medications   FLUoxetine (PROZAC) 10 MG capsule    Sig: Take 1 capsule (10 mg total) by mouth daily.    Dispense:  60 capsule    Refill:  0   losartan (COZAAR) 25 MG tablet    Sig: Take 1 tablet (25 mg total) by mouth daily.    Dispense:  90 tablet    Refill:  1   ferrous sulfate 325 (65 FE) MG tablet    Sig: Take 1 tablet (325 mg total) by mouth daily before breakfast.    Dispense:  90 tablet    Refill:  1    Follow-up: Return in about 6 weeks (around 04/27/2024).    Laniyah Rosenwald R Addilee Neu, FNP

## 2024-03-16 NOTE — Assessment & Plan Note (Signed)
 Encouraged to take iron with vitamin C to improve absorption - ferrous sulfate 325 (65 FE) MG tablet; Take 1 tablet (325 mg total) by mouth daily before breakfast.  Dispense: 90 tablet; Refill: 1 - CBC - Iron, TIBC and Ferritin Panel

## 2024-03-16 NOTE — Assessment & Plan Note (Signed)
 -   US  PELVIC COMPLETE WITH TRANSVAGINAL to rule out fibroids

## 2024-03-16 NOTE — Patient Instructions (Addendum)
 To Improve Iron absorption   Take separately from foods that may impair absorption Meals Coffee Eggs Oxalates (spinach, kale, beets, nuts, chocolate, tea, wheat bran, rhubarb, strawberries, some herbs) Phytates (soy, fiber, cereals, some nuts, beans, lentils, peas) Tannates (tea, cocoa, some spices, some berries) Calcium (milk, yogurt, cheese, some greens, fish) Minimize exposure to medications that decrease gastric acidity Antacids (take iron 2 hours before or 4 hours after the antacid) Histamine receptor blockers (discontinue if no longer needed) Proton pump inhibitors (discontinue if no longer needed) Take with foods/supplements that may increase absorption Vitamin C Orange juice        1. Morbid obesity with BMI of 70 and over, adult (HCC) (Primary)  - Amb Referral to Bariatric Surgery  2. Anxiety and depression  - FLUoxetine (PROZAC) 10 MG capsule; Take 1 capsule (10 mg total) by mouth daily.  Dispense: 60 capsule; Refill: 0  3. Primary hypertension  - losartan (COZAAR) 25 MG tablet; Take 1 tablet (25 mg total) by mouth daily.  Dispense: 90 tablet; Refill: 1  4. Iron deficiency anemia secondary to inadequate dietary iron intake  - ferrous sulfate 325 (65 FE) MG tablet; Take 1 tablet (325 mg total) by mouth daily before breakfast.  Dispense: 90 tablet; Refill: 1  5. Menorrhagia with regular cycle  - US  PELVIC COMPLETE WITH TRANSVAGINAL; Future     It is important that you exercise regularly at least 30 minutes 5 times a week as tolerated  Think about what you will eat, plan ahead. Choose " clean, green, fresh or frozen" over canned, processed or packaged foods which are more sugary, salty and fatty. 70 to 75% of food eaten should be vegetables and fruit. Three meals at set times with snacks allowed between meals, but they must be fruit or vegetables. Aim to eat over a 12 hour period , example 7 am to 7 pm, and STOP after  your last meal of the day. Drink  water,generally about 64 ounces per day, no other drink is as healthy. Fruit juice is best enjoyed in a healthy way, by EATING the fruit.  Thanks for choosing [facility] we consider it a privelige to serve you.

## 2024-03-16 NOTE — Assessment & Plan Note (Signed)
 Controlled on losartan 25 mg daily Continue current medication DASH diet and commitment to daily physical activity for a minimum of 30 minutes discussed and encouraged, as a part of hypertension management. The importance of attaining a healthy weight is also discussed.     03/16/2024    8:58 AM 03/16/2024    8:44 AM 01/16/2024   10:30 AM 12/17/2023    2:05 PM 12/17/2023    1:30 PM 12/17/2023    1:23 PM 10/13/2023    8:00 AM  BP/Weight  Systolic BP 118 128 122 144 130 131 132  Diastolic BP 77 85 89 88 91 91 83  Wt. (Lbs)  377 374   380 383  BMI  71.23 kg/m2 70.67 kg/m2   71.8 kg/m2 72.37 kg/m2

## 2024-03-17 ENCOUNTER — Other Ambulatory Visit: Payer: Self-pay

## 2024-03-17 LAB — CBC
Hematocrit: 37.3 % (ref 34.0–46.6)
Hemoglobin: 10.1 g/dL — ABNORMAL LOW (ref 11.1–15.9)
MCH: 18.1 pg — ABNORMAL LOW (ref 26.6–33.0)
MCHC: 27.1 g/dL — ABNORMAL LOW (ref 31.5–35.7)
MCV: 67 fL — ABNORMAL LOW (ref 79–97)
Platelets: 400 10*3/uL (ref 150–450)
RBC: 5.58 x10E6/uL — ABNORMAL HIGH (ref 3.77–5.28)
RDW: 18.1 % — ABNORMAL HIGH (ref 11.7–15.4)
WBC: 8.8 10*3/uL (ref 3.4–10.8)

## 2024-03-17 LAB — IRON,TIBC AND FERRITIN PANEL
Ferritin: 9 ng/mL — ABNORMAL LOW (ref 15–150)
Iron Saturation: 5 % — CL (ref 15–55)
Iron: 19 ug/dL — ABNORMAL LOW (ref 27–159)
Total Iron Binding Capacity: 403 ug/dL (ref 250–450)
UIBC: 384 ug/dL (ref 131–425)

## 2024-03-18 ENCOUNTER — Other Ambulatory Visit: Payer: Self-pay

## 2024-03-19 ENCOUNTER — Ambulatory Visit (HOSPITAL_COMMUNITY): Payer: Self-pay

## 2024-03-22 ENCOUNTER — Ambulatory Visit (HOSPITAL_COMMUNITY)

## 2024-03-29 ENCOUNTER — Ambulatory Visit (HOSPITAL_COMMUNITY)
Admission: RE | Admit: 2024-03-29 | Discharge: 2024-03-29 | Disposition: A | Discharge status: Home / Self Care | Payer: Self-pay | Source: Ambulatory Visit | Attending: Nurse Practitioner | Admitting: Nurse Practitioner

## 2024-03-29 DIAGNOSIS — N920 Excessive and frequent menstruation with regular cycle: Secondary | ICD-10-CM | POA: Insufficient documentation

## 2024-03-30 ENCOUNTER — Other Ambulatory Visit: Payer: Self-pay | Admitting: Nurse Practitioner

## 2024-03-30 DIAGNOSIS — N920 Excessive and frequent menstruation with regular cycle: Secondary | ICD-10-CM

## 2024-04-14 ENCOUNTER — Other Ambulatory Visit: Payer: Self-pay

## 2024-04-14 ENCOUNTER — Other Ambulatory Visit (HOSPITAL_COMMUNITY): Payer: Self-pay

## 2024-04-27 ENCOUNTER — Other Ambulatory Visit (HOSPITAL_COMMUNITY): Payer: Self-pay

## 2024-04-30 ENCOUNTER — Ambulatory Visit: Payer: Self-pay | Admitting: Nurse Practitioner

## 2024-05-07 ENCOUNTER — Ambulatory Visit: Admitting: Nurse Practitioner

## 2024-05-17 ENCOUNTER — Ambulatory Visit: Admitting: Nurse Practitioner

## 2024-06-18 ENCOUNTER — Other Ambulatory Visit (HOSPITAL_COMMUNITY): Payer: Self-pay

## 2024-06-29 ENCOUNTER — Other Ambulatory Visit (HOSPITAL_COMMUNITY): Payer: Self-pay

## 2024-09-02 ENCOUNTER — Ambulatory Visit: Payer: Self-pay | Admitting: *Deleted

## 2024-09-02 NOTE — Telephone Encounter (Signed)
 Pt was advised to go to ED due to depression. Please advise if you can not se note

## 2024-09-02 NOTE — Telephone Encounter (Signed)
 FYI Only or Action Required?: Action required by provider: request for appointment, update on patient condition, request for documentation or forms, and if medication is needed.  Patient was last seen in primary care on 03/16/2024 by Paseda, Folashade R, FNP.  Called Nurse Triage reporting Depression.  Symptoms began several weeks ago.  Interventions attempted: Other: trying to get up daily but struggles to do any ADLs or eat/ drink.  Symptoms are: rapidly worsening.  Triage Disposition: Go to ED Now (Notify PCP)  Patient/caregiver understands and will follow disposition?: Unsure           Copied from CRM [id number]. Topic: Clinical - Red Word Triage >> Sep 02, 2024  9:13 AM Dana Burnett wrote: Dana Burnett that prompted transfer to Nurse Triage: Mental health ( need to f/u std form ) - worsening: severe depression Reason for Disposition  [1] Depression AND [2] unable to do any of normal activities (e.Burnett., self-care, school, work; in comparison to baseline)  Answer Assessment - Initial Assessment Questions Recommended ED today . No available appt with PCP until 09/22/24. Patient reports she has missed 5 days of work now sleeps all of the time and now HR sending paperwork via email . Patient denies wanting to hurt self or others. Not taking any medication for depression. Please advise     1. CONCERN: What happened that made you call today?     Depression in the past and now feeling worsening feelings  2. DEPRESSION SYMPTOM SCREENING: How are you feeling overall? (e.Burnett., decreased energy, increased sleeping or difficulty sleeping, difficulty concentrating, feelings of sadness, guilt, hopelessness, or worthlessness)     Sleeping all of the time. Not eating well and not doing ADLs, trouble concentrating.  3. RISK OF HARM - SUICIDAL IDEATION:  Do you ever have thoughts of hurting or killing yourself?  (e.Burnett., yes, no, no but preoccupation with thoughts about death)     No  4. RISK OF  HARM - HOMICIDAL IDEATION:  Do you ever have thoughts of hurting or killing someone else?  (e.Burnett., yes, no, no but preoccupation with thoughts about death)     No  5. FUNCTIONAL IMPAIRMENT: How have things been going for you overall? Have you had more difficulty than usual doing your normal daily activities?  (e.Burnett., better, same, worse; self-care, school, work, interactions)     Not good worsening does not want to get out of bed.  6. SUPPORT: Who is with you now? Who do you live with? Do you have family or friends who you can talk to?      Fiancee. 7. THERAPIST: Do you have a counselor or therapist? If Yes, ask: What is their name?     No  8. STRESSORS: Has there been any new stress or recent changes in your life?     Yes  9. ALCOHOL USE OR SUBSTANCE USE (DRUG USE): Do you drink alcohol or use any illegal drugs?     No but does drink occasionally 10. OTHER: Do you have any other physical symptoms right now? (e.Burnett., fever)       Stress over work, personal life and recent loss in life. Missing work x 5 days so far. 11. PREGNANCY: Is there any chance you are pregnant? When was your last menstrual period?       na  Protocols used: Depression-A-AH

## 2024-10-22 ENCOUNTER — Ambulatory Visit: Payer: Self-pay

## 2024-10-22 NOTE — Telephone Encounter (Signed)
 FYI Only or Action Required?: FYI only for provider: proceeding to urgent care.  Patient was last seen in primary care on 03/16/2024 by Paseda, Folashade R, FNP.  Called Nurse Triage reporting Cough.  Symptoms began several weeks ago.  Interventions attempted: OTC medications: Nyquil and Rest, hydration, or home remedies.  Symptoms are: unchanged.  Triage Disposition: See Physician Within 24 Hours (overriding Home Care)  Patient/caregiver understands and will follow disposition?: Yes   Copied from CRM #[id number]. Topic: Clinical - Red Word Triage >> Oct 22, 2024 11:09 AM Willma SAUNDERS wrote: [facility] that prompted transfer to Nurse Triage: Patient states she had a cough two weeks ago and still has a persistent cough causing a sore/achy throat. Every once in a while there will be discolored mucous. Reason for Disposition  Cough  Answer Assessment - Initial Assessment Questions Additional info: Nyquil without effect. Requesting sdv, none are available, patient plans to proceed to urgent care today.     1. ONSET: When did the cough begin?      2 weeks ago  2. SEVERITY: How bad is the cough today?      Persistent  3. SPUTUM: Describe the color of your sputum (e.g., none, dry cough; clear, white, yellow, green)     Intermittent  4. HEMOPTYSIS: Are you coughing up any blood? If Yes, ask: How much? (e.g., flecks, streaks, tablespoons, etc.)     denies 5. DIFFICULTY BREATHING: Are you having difficulty breathing? If Yes, ask: How bad is it? (e.g., mild, moderate, severe)      Intermittent shortness of breath with cough 6. FEVER: Do you have a fever? If Yes, ask: What is your temperature, how was it measured, and when did it start?     denies 7. CARDIAC HISTORY: Do you have any history of heart disease? (e.g., heart attack, congestive heart failure)       8. LUNG HISTORY: Do you have any history of lung disease?  (e.g., pulmonary embolus, asthma, emphysema)      9.  PE RISK FACTORS: Do you have a history of blood clots? (or: recent major surgery, recent prolonged travel, bedridden)      10. OTHER SYMPTOMS: Do you have any other symptoms? (e.g., runny nose, wheezing, chest pain)       Mild sore throat  11. PREGNANCY: Is there any chance you are pregnant? When was your last menstrual period?        12. TRAVEL: Have you traveled out of the country in the last month? (e.g., travel history, exposures)  Protocols used: Cough - Acute Productive-A-AH

## 2024-10-23 ENCOUNTER — Ambulatory Visit (HOSPITAL_COMMUNITY)
Admission: EM | Admit: 2024-10-23 | Discharge: 2024-10-23 | Disposition: A | Discharge status: Home / Self Care | Payer: Self-pay

## 2024-10-23 ENCOUNTER — Ambulatory Visit (HOSPITAL_COMMUNITY): Payer: Self-pay

## 2024-10-23 ENCOUNTER — Encounter (HOSPITAL_COMMUNITY): Payer: Self-pay | Admitting: *Deleted

## 2024-10-23 ENCOUNTER — Ambulatory Visit (INDEPENDENT_AMBULATORY_CARE_PROVIDER_SITE_OTHER): Payer: Self-pay

## 2024-10-23 ENCOUNTER — Other Ambulatory Visit: Payer: Self-pay

## 2024-10-23 DIAGNOSIS — J189 Pneumonia, unspecified organism: Secondary | ICD-10-CM

## 2024-10-23 MED ORDER — ALBUTEROL SULFATE HFA 108 (90 BASE) MCG/ACT IN AERS: 1.0000 | INHALATION_SPRAY | Freq: Four times a day (QID) | RESPIRATORY_TRACT | 0 refills | Status: AC | PRN

## 2024-10-23 MED ORDER — AZELASTINE HCL 0.1 % NA SOLN: 1.0000 | Freq: Two times a day (BID) | NASAL | 1 refills | Status: AC

## 2024-10-23 MED ORDER — AZITHROMYCIN 250 MG PO TABS: ORAL_TABLET | ORAL | 0 refills | Status: AC

## 2024-10-23 MED ORDER — PROMETHAZINE-DM 6.25-15 MG/5ML PO SYRP: 10.0000 mL | ORAL_SOLUTION | Freq: Three times a day (TID) | ORAL | 0 refills | Status: AC | PRN

## 2024-10-23 NOTE — Discharge Instructions (Addendum)
  1. Community acquired pneumonia, unspecified laterality (Primary) - DG Chest 2 View x-ray performed in UC shows possible hazy infiltrates to bilateral lower lobes, haziness may also be inflammation secondary to bronchitis.  Empiric treatment with azithromycin  will be initiated. - azithromycin  (ZITHROMAX  Z-PAK) 250 MG tablet; Take 500 mg on day 1 followed by 250 mg for 4 days.  Take medication for a total of 5 days.  Dispense: 6 tablet; Refill: 0 - albuterol  (VENTOLIN  HFA) 108 (90 Base) MCG/ACT inhaler; Inhale 1-2 puffs into the lungs every 6 (six) hours as needed for wheezing or shortness of breath.  Dispense: 6.7 g; Refill: 0 - azelastine  (ASTELIN ) 0.1 % nasal spray; Place 1 spray into both nostrils 2 (two) times daily. Use in each nostril as directed  Dispense: 30 mL; Refill: 1 - promethazine -dextromethorphan (PROMETHAZINE -DM) 6.25-15 MG/5ML syrup; Take 10 mLs by mouth 3 (three) times daily as needed for cough.  Dispense: 240 mL; Refill: 0  -Continue to monitor symptoms for any change in severity if there is any escalation of current symptoms or development of new symptoms follow-up in ER for further evaluation and management.

## 2024-10-23 NOTE — ED Triage Notes (Addendum)
 PT reports for 2 weeks she has had a cough with SHOB and wheezing. PT took OTC for cough with out relief.  PT has a out of the country trip at the end of DEC. Pt wants the cough cleaned up by then.

## 2024-10-23 NOTE — ED Provider Notes (Signed)
 [facility]  Note:  This document was prepared using Conservation officer, historic buildings and may include unintentional dictation errors.  MRN: [id number] DOB: 1990-12-14  Subjective:   Dana Burnett is a 33 y.o. female presenting for evaluation of persistent cough x 2 weeks with occasional shortness of breath and wheezing.  Patient reports she has been taking over-the-counter equate cough and cold medication with minimal improvement to symptoms.  Patient denies any known sick contacts.  No shortness of breath, chest pain, weakness, dizziness, fever, body aches, sore throat.  Patient reports that she will be traveling at the end of December and was hoping for treatment to resolve symptoms prior to her trip.  Patient denies any past history of asthma or COPD.  No current facility-administered medications for this encounter.  Current Outpatient Medications:    albuterol  (VENTOLIN  HFA) 108 (90 Base) MCG/ACT inhaler, Inhale 1-2 puffs into the lungs every 6 (six) hours as needed for wheezing or shortness of breath., Disp: 6.7 g, Rfl: 0   azelastine  (ASTELIN ) 0.1 % nasal spray, Place 1 spray into both nostrils 2 (two) times daily. Use in each nostril as directed, Disp: 30 mL, Rfl: 1   azithromycin  (ZITHROMAX  Z-PAK) 250 MG tablet, Take 500 mg on day 1 followed by 250 mg for 4 days.  Take medication for a total of 5 days., Disp: 6 tablet, Rfl: 0   promethazine -dextromethorphan (PROMETHAZINE -DM) 6.25-15 MG/5ML syrup, Take 10 mLs by mouth 3 (three) times daily as needed for cough., Disp: 240 mL, Rfl: 0   diazepam (VALIUM) 10 MG tablet, Take 10 mg by mouth daily as needed. (Patient not taking: Reported on 12/17/2023), Disp: , Rfl:    ferrous sulfate  325 (65 FE) MG tablet, Take 1 tablet (325 mg total) by mouth daily before breakfast., Disp: 90 tablet, Rfl: 1   FLUoxetine  (PROZAC ) 10 MG capsule, Take 1 capsule (10 mg total) by mouth daily., Disp: 60 capsule, Rfl: 0   ibuprofen  (ADVIL ) 600 MG  tablet, Take 1 tablet (600 mg total) by mouth every 8 (eight) hours as needed., Disp: 30 tablet, Rfl: 0   losartan  (COZAAR ) 25 MG tablet, Take 1 tablet (25 mg total) by mouth daily., Disp: 90 tablet, Rfl: 1   Semaglutide -Weight Management 2.4 MG/0.75ML SOAJ, Inject 2.4 mg into the skin once a week., Disp: 3 mL, Rfl: 3   Allergies  Allergen Reactions   Shellfish Allergy Anaphylaxis    Past Medical History:  Diagnosis Date   Allergy    Anemia    Anxiety    Depression    Hypertension    Sleep apnea      Past Surgical History:  Procedure Laterality Date   CHOLECYSTECTOMY N/A 07/14/2017   Procedure: LAPAROSCOPIC CHOLECYSTECTOMY WITH INTRAOPERATIVE CHOLANGIOGRAM;  Surgeon: Belinda Cough, MD;  Location: [facility] OR;  Service: General;  Laterality: N/A;   TOOTH EXTRACTION      Family History  Problem Relation Age of Onset   HIV/AIDS Mother    Drug abuse Father    Stomach cancer Paternal Aunt    Diabetes Maternal Grandfather    Hypertension Maternal Grandfather    Cancer Paternal Grandmother    Diabetes Paternal Grandmother    Hypertension Paternal Grandmother    Lung cancer Paternal Grandmother    Cancer Other     Social History   Tobacco Use   Smoking status: Never   Smokeless tobacco: Never  Vaping Use   Vaping status: Never Used  Substance Use Topics   Alcohol use:  Yes    Comment: occ   Drug use: No    ROS Refer to HPI for ROS details.  Objective:    Vitals: BP (!) 156/119 (BP Location: Right Wrist)   Pulse (!) 109   Temp 98.5 F (36.9 C)   Resp 20   LMP 09/23/2024 (Approximate)   SpO2 97%   Physical Exam Vitals and nursing note reviewed.  Constitutional:      General: She is not in acute distress.    Appearance: Normal appearance. She is well-developed. She is not ill-appearing or toxic-appearing.  HENT:     Head: Normocephalic and atraumatic.  Cardiovascular:     Rate and Rhythm: Normal rate and regular rhythm.     Heart sounds: Normal heart  sounds.  Pulmonary:     Effort: Pulmonary effort is normal. No respiratory distress.     Breath sounds: Normal breath sounds. No stridor. No wheezing, rhonchi or rales.  Chest:     Chest wall: No tenderness.  Skin:    General: Skin is warm and dry.  Neurological:     General: No focal deficit present.     Mental Status: She is alert and oriented to person, place, and time.  Psychiatric:        Mood and Affect: Mood normal.        Behavior: Behavior normal.     Procedures  No results found for this or any previous visit (from the past 24 hours).  Assessment and Plan :     Discharge Instructions       1. Community acquired pneumonia, unspecified laterality (Primary) - DG Chest 2 View x-ray performed in UC shows possible hazy infiltrates to bilateral lower lobes, haziness may also be inflammation secondary to bronchitis.  Empiric treatment with azithromycin  will be initiated. - azithromycin  (ZITHROMAX  Z-PAK) 250 MG tablet; Take 500 mg on day 1 followed by 250 mg for 4 days.  Take medication for a total of 5 days.  Dispense: 6 tablet; Refill: 0 - albuterol  (VENTOLIN  HFA) 108 (90 Base) MCG/ACT inhaler; Inhale 1-2 puffs into the lungs every 6 (six) hours as needed for wheezing or shortness of breath.  Dispense: 6.7 g; Refill: 0 - azelastine  (ASTELIN ) 0.1 % nasal spray; Place 1 spray into both nostrils 2 (two) times daily. Use in each nostril as directed  Dispense: 30 mL; Refill: 1 - promethazine -dextromethorphan (PROMETHAZINE -DM) 6.25-15 MG/5ML syrup; Take 10 mLs by mouth 3 (three) times daily as needed for cough.  Dispense: 240 mL; Refill: 0  -Continue to monitor symptoms for any change in severity if there is any escalation of current symptoms or development of new symptoms follow-up in ER for further evaluation and management.      Abbigayle Toole B Yocheved Depner   [place]ven Gilkes, [place], [place] 10/23/24 1126

## 2024-12-27 ENCOUNTER — Encounter: Payer: Self-pay | Admitting: Obstetrics and Gynecology

## 2025-04-20 ENCOUNTER — Encounter: Payer: Self-pay | Admitting: Nurse Practitioner
